# Patient Record
Sex: Male | Born: 2000 | Race: White | Hispanic: No | Marital: Single | Smoking: Never smoker
Health system: Southern US, Community
[De-identification: ages and names within clinical notes are randomized; demographics above are authoritative.]

## PROBLEM LIST (undated history)

## (undated) DIAGNOSIS — H6690 Otitis media, unspecified, unspecified ear: Secondary | ICD-10-CM

## (undated) DIAGNOSIS — T7840XA Allergy, unspecified, initial encounter: Secondary | ICD-10-CM

## (undated) DIAGNOSIS — J329 Chronic sinusitis, unspecified: Secondary | ICD-10-CM

## (undated) HISTORY — PX: TYMPANOSTOMY TUBE PLACEMENT: SHX32

## (undated) HISTORY — DX: Allergy, unspecified, initial encounter: T78.40XA

---
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
## 2019-01-12 ENCOUNTER — Other Ambulatory Visit: Payer: Self-pay | Admitting: Pediatrics

## 2019-01-12 DIAGNOSIS — J4520 Mild intermittent asthma, uncomplicated: Secondary | ICD-10-CM

## 2019-01-21 ENCOUNTER — Ambulatory Visit (INDEPENDENT_AMBULATORY_CARE_PROVIDER_SITE_OTHER): Payer: Medicaid Other | Admitting: Pediatrics

## 2019-01-21 ENCOUNTER — Other Ambulatory Visit: Payer: Self-pay

## 2019-01-21 DIAGNOSIS — L243 Irritant contact dermatitis due to cosmetics: Secondary | ICD-10-CM

## 2019-01-21 MED ORDER — PREDNISONE 10 MG PO TABS: 10.0000 mg | ORAL_TABLET | Freq: Every day | ORAL | 0 refills | Status: DC

## 2019-01-21 NOTE — Progress Notes (Signed)
Virtual Visit via Telephone Note  I connected with Todd Jacobs on 01/21/19 at  2:10 PM EDT by a video enabled telemedicine application and verified that I am speaking with the correct person using two identifiers.  Location: Patient: Todd Jacobs, accompanied by his mom Provider: Harolyn Rutherford, DO Attending: Dr. Lockie Pares   I discussed the limitations of evaluation and management by telemedicine and the availability of in person appointments. The patient expressed understanding and agreed to proceed.  History of Present Illness: Todd Jacobs is a 18 y/o male with PMH of asthma, seasonal allergies, and acne vulgaris who presents today with a facial rash. He states he applied some lotion with menthol.Shortly after he began developing a red, non-painful, itchy rash all over his face and the areas where he applied the gel. It is very itchy and is getting worse with some swelling. He denies any difficulty breathing, SOB, throat closing, fever, nausea, or vomiting. No skin peeling.   Observations/Objective: Gen: reports no distress Resp: speaks in full sentences  Assessment and Plan:  Facial Rash Most likely caused by a reaction from some component of the gel he used for sun and skin protection.  - Prednisone taper for 12 days. Instructions given to mom to have Todd Jacobs finish the full course of the taper and to start with 40mg  for 3 days then 30mg  for 3 days then 20mg  for 3 days and finally 10mg  for 3 days and then stop.  - Return precautions discussed.  Follow Up Instructions:  I discussed the assessment and treatment plan with the patient. The patient was provided an opportunity to ask questions and all were answered. The patient agreed with the plan and demonstrated an understanding of the instructions.   The patient was advised to call back or seek an in-person evaluation if the symptoms worsen or if the condition fails to improve as anticipated.  I provided >15 minutes of  non-face-to-face time during this encounter.   Todd Alpha, DO [facility], PGY-3

## 2019-02-26 IMAGING — CT CT TEMPORAL BONES W/O CM
1 series · 15 of 30 positions shown, 19 images · non-contrast
Comparison: None.

CLINICAL DATA: 16-year-old male with episodic vertigo. Non
pulsatile tinnitus. Bilateral hearing loss. Numbness for 1 month.
Tympanostomy tubes as a child.

EXAM:
CT TEMPORAL BONES WITHOUT CONTRAST
TECHNIQUE: Axial and coronal plane CT imaging of the petrous temporal bones was
performed with thin-collimation image reconstruction. No intravenous
contrast was administered. Multiplanar CT image reconstructions were
also generated.

[Series 4: brain · axial · 0.38mm/px · z∈[+1139,+1197]mm · 15 of 33 slices shown, 19 images]
[im 2/33  brain]
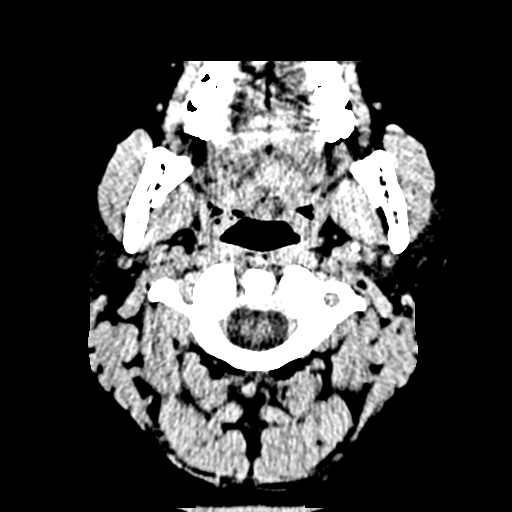
[im 2/33  bone]
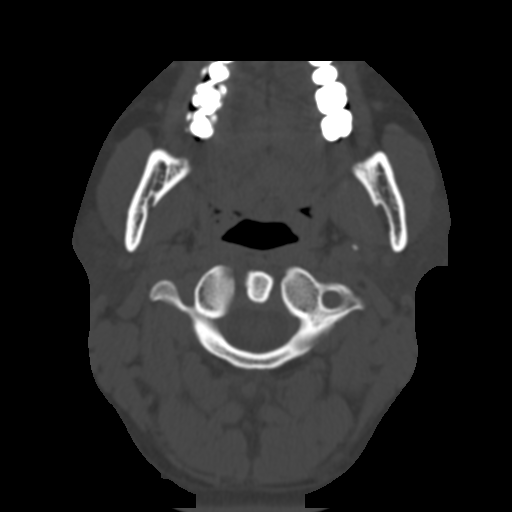
[im 4/33  bone]
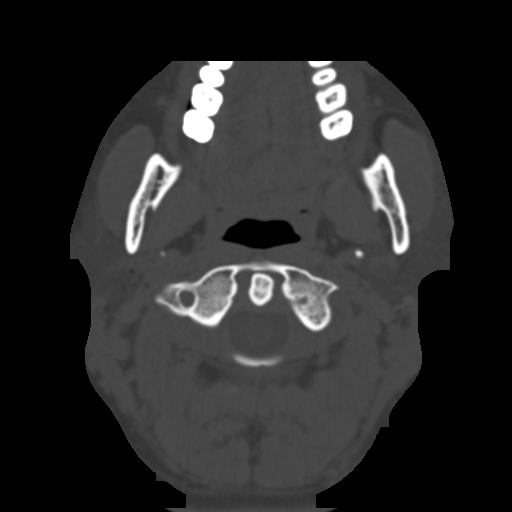
[im 6/33  bone]
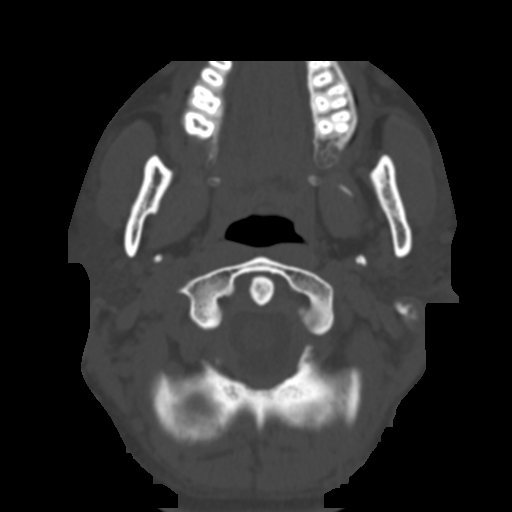
[im 8/33  bone]
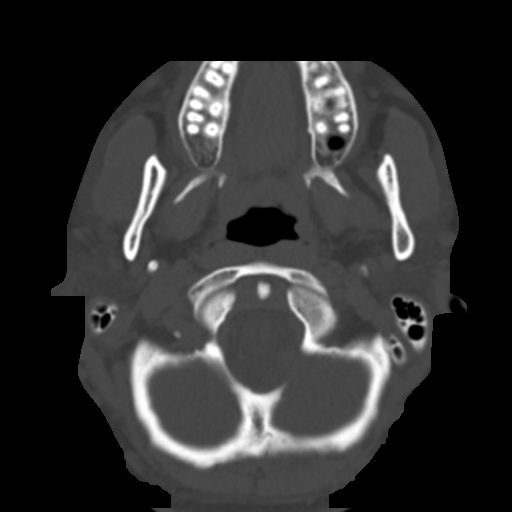
[im 10/33  brain]
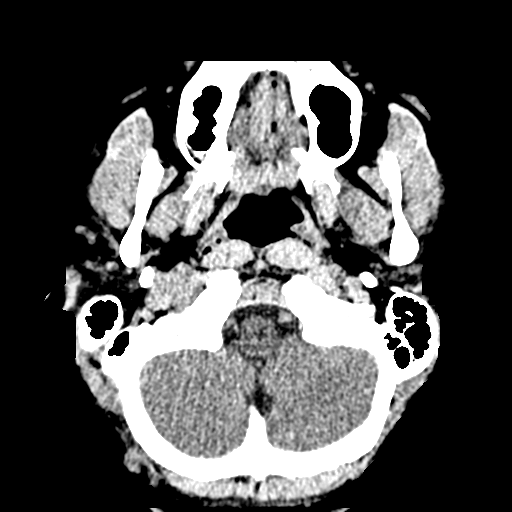
[im 10/33  bone]
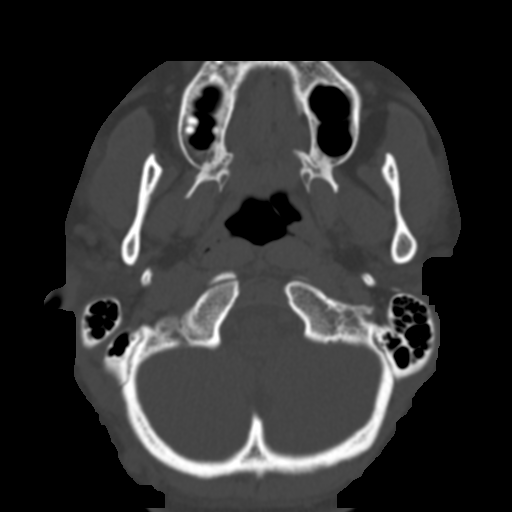
[im 13/33  bone]
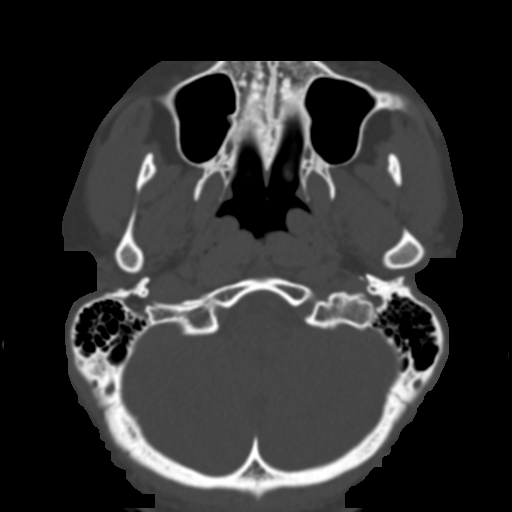
[im 15/33  bone]
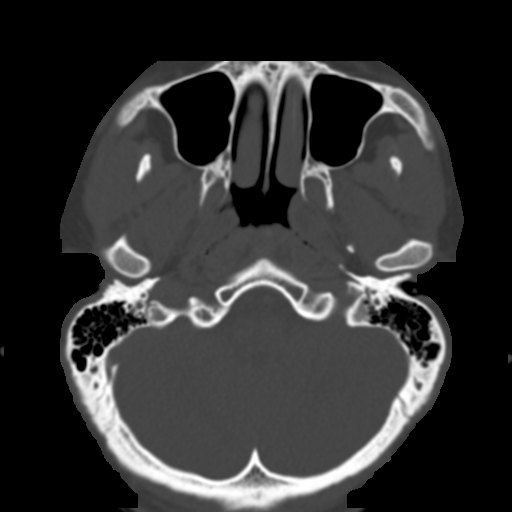
[im 17/33  bone]
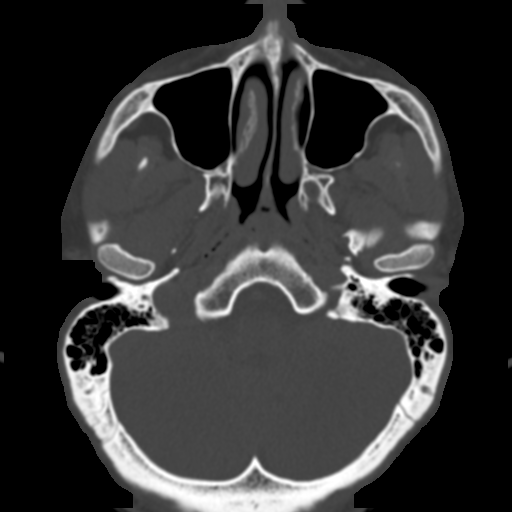
[im 18/33  brain]
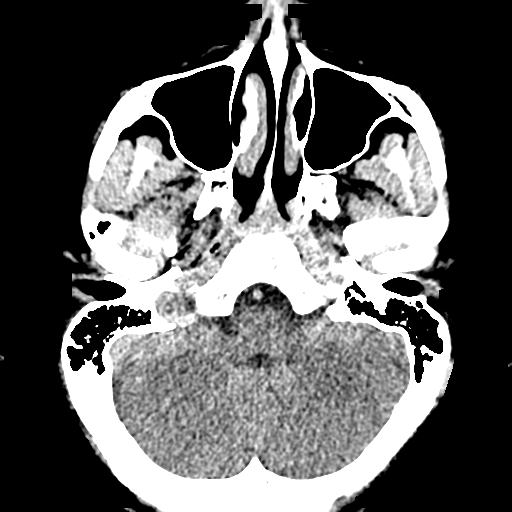
[im 18/33  bone]
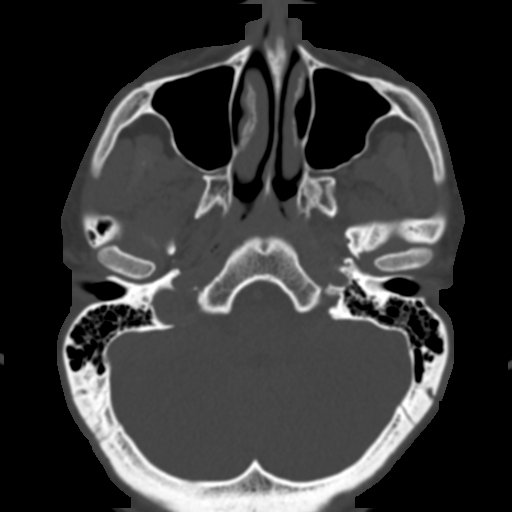
[im 20/33  bone]
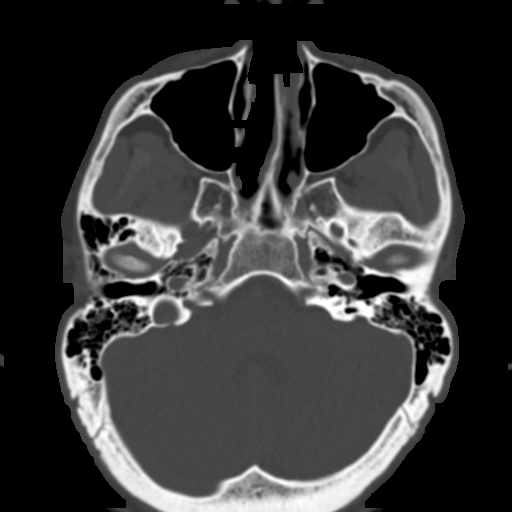
[im 23/33  bone]
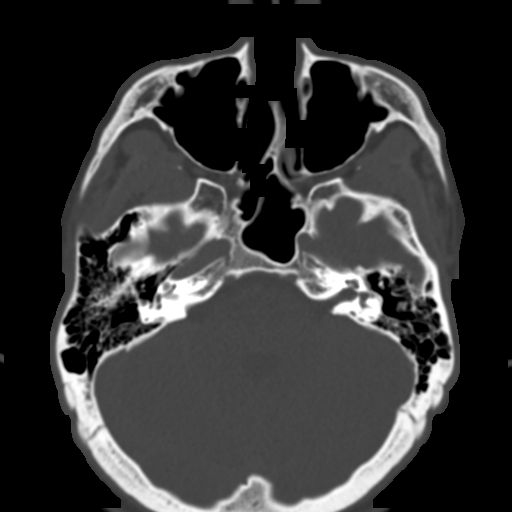
[im 25/33  bone]
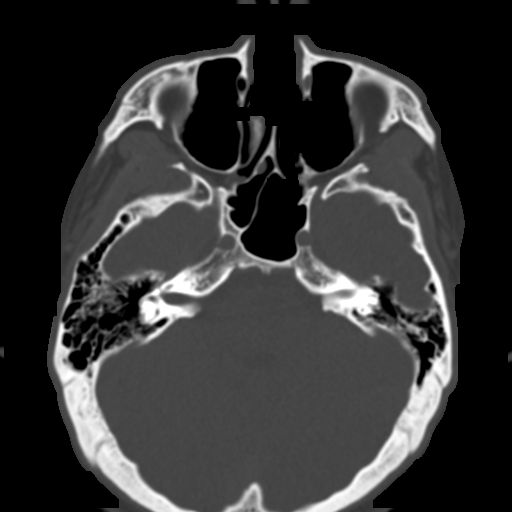
[im 27/33  brain]
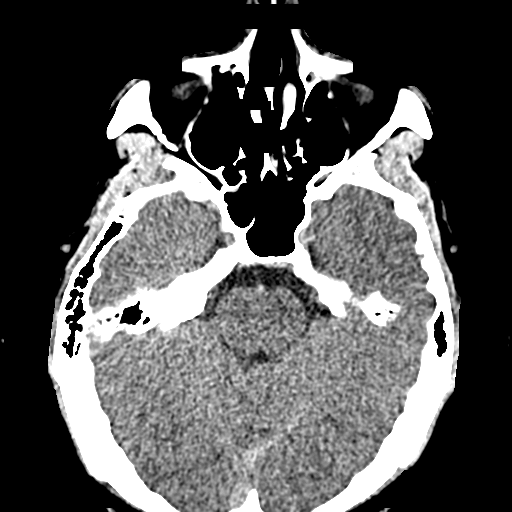
[im 27/33  bone]
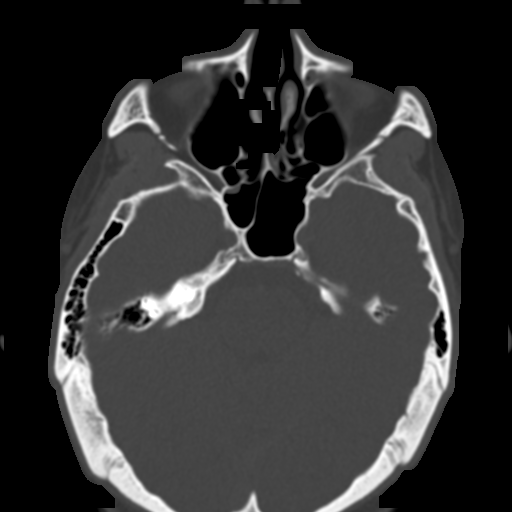
[im 29/33  bone]
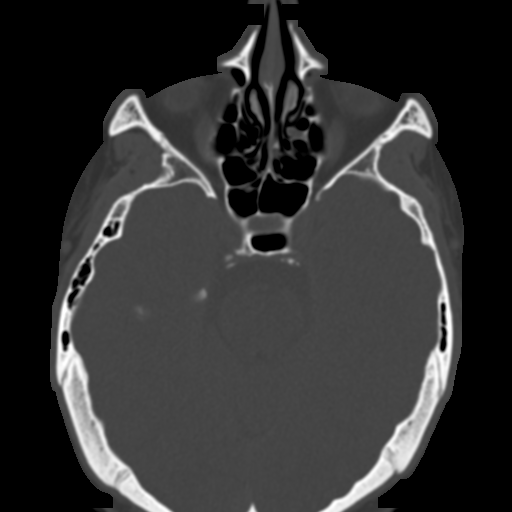
[im 31/33  bone]
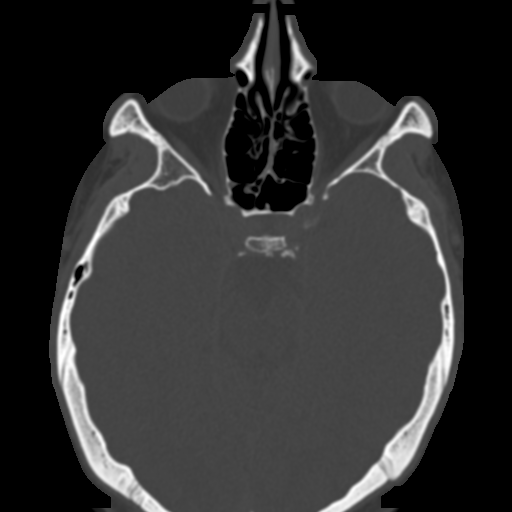

[15 of 30 positions shown; findings below may reference images not displayed]

FINDINGS: Negative visible noncontrast brain parenchyma. Visualized orbit soft
tissues are within normal limits. Visualized scalp soft tissues are
within normal limits. Negative visible noncontrast deep soft tissue
spaces of the face.

Minor bilateral mucosal thickening in the visible paranasal sinuses.
Skull base bone mineralization is normal.

LEFT TEMPORAL BONE:

Normal left external auditory canal except for minimal superficial
debris. The left tympanic membrane is normal. The left tympanic
cavity is clear. The ossicles are intact and normally aligned. The
scutum is intact. The left mastoid antrum and mastoid air cells are
clear.

The left IAC, cochlea, vestibule and vestibular aqueduct appear
normal. The left semicircular canals and course of the left 7th
nerve appear normal.

RIGHT TEMPORAL BONE:

Normal right EAC aside from minimal debris superficially. Normal
right tympanic membrane. The right tympanic cavity is clear. The
right scutum is intact. The right ossicles are intact and normally
aligned. The right mastoid antrum and right mastoid air cells are
clear. There is also pneumatization of the left lateral sphenoid
wing.

The right IAC, cochlea, vestibule and vestibular aqueduct appear
normal. The right semicircular canals and course of the right 7th
nerve appear normal.
IMPRESSION: 1. Normal CT appearance of the bilateral temporal bones.
2. Minimal paranasal sinus mucosal thickening.

## 2019-03-02 ENCOUNTER — Other Ambulatory Visit: Payer: Self-pay | Admitting: Pediatrics

## 2019-03-02 DIAGNOSIS — J302 Other seasonal allergic rhinitis: Secondary | ICD-10-CM

## 2019-03-02 NOTE — Telephone Encounter (Signed)
Refill per request. Needs appointment. Last appointment over 1 year ago.

## 2019-04-22 ENCOUNTER — Ambulatory Visit (INDEPENDENT_AMBULATORY_CARE_PROVIDER_SITE_OTHER): Payer: Medicaid Other | Admitting: Pediatrics

## 2019-04-22 ENCOUNTER — Other Ambulatory Visit: Payer: Self-pay

## 2019-04-22 DIAGNOSIS — Z20822 Contact with and (suspected) exposure to covid-19: Secondary | ICD-10-CM

## 2019-04-22 DIAGNOSIS — Z20828 Contact with and (suspected) exposure to other viral communicable diseases: Secondary | ICD-10-CM | POA: Diagnosis not present

## 2019-04-22 DIAGNOSIS — R509 Fever, unspecified: Secondary | ICD-10-CM

## 2019-04-22 DIAGNOSIS — J069 Acute upper respiratory infection, unspecified: Secondary | ICD-10-CM | POA: Insufficient documentation

## 2019-04-22 DIAGNOSIS — B349 Viral infection, unspecified: Secondary | ICD-10-CM | POA: Diagnosis not present

## 2019-04-22 NOTE — Assessment & Plan Note (Addendum)
-   Will send patient for COVID testing - Request to see patient again virtually tomorrow morning 04/23/2019 - If patient is worse or the same will request to see him in person tomorrow afternoon 04/23/2019 - Will hold off on prescribing antibiotics until after he is seen - Recommending drinking hot liquids to keep hydrated and to loosen mucus, Honey to decrease cough, Tylenol 500mg  with Advil 200mg  every 4-6 hours as needed for headache and body aches. -Continue daily allergy medications, ProAir albuterol inhaler, and flonase inhaler.

## 2019-04-22 NOTE — Progress Notes (Signed)
[facility] Telemedicine Visit  Patient consented to have virtual visit. Method of visit: Telephone  Encounter participants: Patient: Todd Jacobs - located at [facility] - [phone] (mom's cell) Provider: Daisy Floro - located at [facility] Others (if applicable): Maretta Los - mom  Chief Complaint: Diarrhea, body aches  HPI: Last Saturday (September 26th) the patient woke up saying he was up through the night with nausea and diarrhea. Mom watched him for several days and seemed to be bouncing back. Then a runny nose, sore throat, which is par for the course for them this time of year. Now he has body aches. He's getting tylenol 4 tablets every 4 hours, using his inhaler occasionally, allergy medicines (cetirizine and singulair), and mucinex and coricidin. He feels like he has a constriction in his back for 3 days. Patient endorses feeling feverish (thermometer at home is broken), some sneezing, little cough productive for little mucus, he started feeling shortness of breath with this painful point in his back, the first day last Saturday (26th) he was having nausea and he threw up, has had headaches. Diarrhea is watery but not happening every day (just on the first day and last night) green stool, is having abdominal pain down low in his abdomen in the middle, drinking a lot of water and staying hydrated, is able to keep foods down. Feels "like his face is going to fall off", some pain in his teeth.    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  His brother recently moved in and coughs all the time (is a fedex driver).  No new foods, they purchase bottled water, no burning or pain with urination, denies rashes.  Mom has history of lung cancer.  Mom states the patient does not Vape or Smoke.  ROS: per HPI  Pertinent PMHx: Allergies on Singulair, Albuterol  Exam:  Respiratory: speaking in full  sentences HEENT: Mom states he's having redness on his uvula with ?blisters, No pharyngeal exudates, Is having lymphadenopathy.   Assessment/Plan: Upper respiratory tract infection - Will send patient for COVID testing - Request to see patient again virtually tomorrow morning 04/23/2019 - If patient is worse or the same will request to see him in person tomorrow afternoon 04/23/2019 - Will hold off on prescribing antibiotics until after he is seen - Recommending drinking hot liquids to keep hydrated and to loosen mucus, Honey to decrease cough, Tylenol 500mg  with Advil 200mg  every 4-6 hours as needed for headache and body aches. -Continue daily allergy medications, ProAir albuterol inhaler, and flonase inhaler.    Time spent during visit with patient: 21 minutes  Milus Banister, [place], PGY-2 04/22/2019 10:57 AM

## 2019-04-23 ENCOUNTER — Ambulatory Visit (INDEPENDENT_AMBULATORY_CARE_PROVIDER_SITE_OTHER): Payer: Medicaid Other | Admitting: Pediatrics

## 2019-04-23 ENCOUNTER — Other Ambulatory Visit: Payer: Self-pay

## 2019-04-23 VITALS — Temp 96.9°F | Wt 212.0 lb

## 2019-04-23 DIAGNOSIS — B349 Viral infection, unspecified: Secondary | ICD-10-CM

## 2019-04-23 DIAGNOSIS — M791 Myalgia, unspecified site: Secondary | ICD-10-CM

## 2019-04-23 LAB — POC INFLUENZA A&B (BINAX/QUICKVUE)
Influenza A, POC: NEGATIVE
Influenza B, POC: NEGATIVE

## 2019-04-23 LAB — NOVEL CORONAVIRUS, NAA: SARS-CoV-2, NAA: NOT DETECTED

## 2019-04-23 MED ORDER — AMOXICILLIN-POT CLAVULANATE 875-125 MG PO TABS: 1.0000 | ORAL_TABLET | Freq: Two times a day (BID) | ORAL | 0 refills | Status: AC

## 2019-04-23 NOTE — Progress Notes (Addendum)
Subjective:     Todd Jacobs, is a 18 y.o. male   History provider by patient and mother No interpreter necessary.  No chief complaint on file.   HPI:  Patient is currently on day 10 of illness, is still having a runny/stuffy nose, sore throat, body aches, tightness in his chest and back. No diarrhea, shortness of breath. He was tested for COVID yesterday morning which was negative. He has been using tylenol, advil, flonase, and his allergy medications to control his symptoms.   Patient was asked during this encounter about smoking history: he denies vaping and smoking Sexual history was not assessed   Review of Systems   Patient's history was reviewed and updated as appropriate: He  has a past medical history of Asthma, Otitis media, and Sinusitis. He does not have any pertinent problems on file. He  has a past surgical history that includes Tympanostomy tube placement. His family history includes Allergies in an other family member; Asthma in an other family member. He  reports that he is a non-smoker but has been exposed to tobacco smoke. He has never used smokeless tobacco. No history on file for alcohol and drug. He has No Known Allergies..     Objective:     There were no vitals taken for this visit.  Physical Exam Vitals signs and nursing note reviewed.  Constitutional:      Appearance: He is normal weight.  HENT:     Head: Normocephalic and atraumatic.     Right Ear: External ear normal.     Left Ear: External ear normal.     Nose: Congestion and rhinorrhea present.     Mouth/Throat:     Mouth: Mucous membranes are moist.     Pharynx: Posterior oropharyngeal erythema present.     Comments: Swollen right tonsillar fossa with no exudate Eyes:     Extraocular Movements: Extraocular movements intact.     Conjunctiva/sclera: Conjunctivae normal.     Pupils: Pupils are equal, round, and reactive to light.  Neck:     Musculoskeletal: Normal range of  motion.  Cardiovascular:     Rate and Rhythm: Normal rate and regular rhythm.     Pulses: Normal pulses.     Heart sounds: Normal heart sounds.  Pulmonary:     Effort: Pulmonary effort is normal. No respiratory distress.     Breath sounds: Normal breath sounds.  Abdominal:     General: Abdomen is flat. Bowel sounds are normal.     Palpations: Abdomen is soft.  Musculoskeletal: Normal range of motion.  Skin:    General: Skin is warm.     Capillary Refill: Capillary refill takes 2 to 3 seconds.  Neurological:     General: No focal deficit present.     Mental Status: He is alert. Mental status is at baseline.     Cranial Nerves: No cranial nerve deficit.  Psychiatric:        Mood and Affect: Mood normal.        Behavior: Behavior normal.        Thought Content: Thought content normal.        Judgment: Judgment normal.        Assessment & Plan:    Todd Jacobs is a 18 y.o male with upper respiratory symptoms of a runny/stuffy nose, sore throat, body aches, tightness in his chest and back. These could be due to seasonal allergies, common cold, flu, pneumonia, walking pneumonia, sinus infection or strep throat.  Patient tested negative for COVID. We performed rapid influenza testing which was negative. Overall, he presents with a mixed picture of bacterial rhinosinusitis likely 2/2 Viral URTI. Empiric Tx as per UTD with augmentin.  Upper Respiratory Infection/Acute bacterial rhinosinusitis 1. Augmentin for 7 days. 2. Rapid Flu negative  Supportive care and return precautions reviewed.  No follow-ups on file.  Silvana Newness, MD

## 2019-04-23 NOTE — Progress Notes (Signed)
Virtual Visit via Video Note  I connected with Todd Jacobs and his mother on 04/23/19 at 10:20 AM EDT by a video enabled telemedicine application and verified that I am speaking with the correct person using two identifiers.   Location of patient/parent: home   I discussed the limitations of evaluation and management by telemedicine and the availability of in person appointments.  I discussed that the purpose of this telehealth visit is to provide medical care while limiting exposure to the novel coronavirus.  The mother and patient expressed understanding and agreed to proceed.  Reason for visit: follow up of body aches, sore throat, diarrhea  History of Present Illness:  Patient is currently on day 10 of illness, is still having a runny/stuffy nose, sore throat, body aches, tightness in his chest and back. No diarrhea, shortness of breath. He was tested for COVID yesterday morning which was negative. He has been using tylenol, advil, flonase, and his allergy medications to control his symptoms.   Patient was asked during this encounter about smoking history: he denies vaping and smoking Sexual history was not assessed  Observations/Objective: Patient is non-toxic appearing on video visit, walking around the house with ease, sounds congested, no shortness of breath, coughing, rashes or diaphoresis appreciated  Assessment and Plan: Patient's upper respiratory could be due to seasonal allergies, common cold, flu, pneumonia, walking pneumonia, or even a sinus infection. Strep throat is less likely due to age, presence of cough, and lack of exudates on physical exam. Patient tested negative for COVID.   Follow Up Instructions:  As patient's symptoms are about the same despite continued conservative measurements, it would be to the patient's benefit to be evaluated in person in the clinic.   -Patient scheduled for in-person visit today 04/23/2019 at 15:30   I discussed the assessment and  treatment plan with the patient and/or parent/guardian. They were provided an opportunity to ask questions and all were answered. They agreed with the plan and demonstrated an understanding of the instructions.   They were advised to call back or seek an in-person evaluation in the emergency room if the symptoms worsen or if the condition fails to improve as anticipated.  I spent 7 minutes on this telehealth visit inclusive of face-to-face video and care coordination time I was located at [facility] during this encounter.  Daisy Floro, DO

## 2019-04-23 NOTE — Patient Instructions (Signed)

## 2019-05-07 ENCOUNTER — Other Ambulatory Visit: Payer: Self-pay | Admitting: Pediatrics

## 2019-05-07 DIAGNOSIS — J302 Other seasonal allergic rhinitis: Secondary | ICD-10-CM

## 2019-05-07 DIAGNOSIS — J4520 Mild intermittent asthma, uncomplicated: Secondary | ICD-10-CM

## 2019-05-07 NOTE — Telephone Encounter (Signed)
I approved a refill request for Todd Jacobs's montelukast and albuterol today.  He is overdue for his annual PE. Please call him to schedule a PE with Dr. Tami Ribas (next available).

## 2019-06-01 ENCOUNTER — Other Ambulatory Visit: Payer: Self-pay

## 2019-06-01 ENCOUNTER — Other Ambulatory Visit (HOSPITAL_COMMUNITY)
Admission: RE | Admit: 2019-06-01 | Discharge: 2019-06-01 | Disposition: A | Discharge status: Home / Self Care | Payer: Medicaid Other | Source: Ambulatory Visit | Attending: Pediatrics | Admitting: Pediatrics

## 2019-06-01 ENCOUNTER — Encounter: Payer: Self-pay | Admitting: Pediatrics

## 2019-06-01 ENCOUNTER — Ambulatory Visit (INDEPENDENT_AMBULATORY_CARE_PROVIDER_SITE_OTHER): Payer: Medicaid Other | Admitting: Pediatrics

## 2019-06-01 VITALS — BP 110/80 | HR 74 | Ht 67.5 in | Wt 224.2 lb

## 2019-06-01 DIAGNOSIS — J302 Other seasonal allergic rhinitis: Secondary | ICD-10-CM | POA: Diagnosis not present

## 2019-06-01 DIAGNOSIS — Z0001 Encounter for general adult medical examination with abnormal findings: Secondary | ICD-10-CM

## 2019-06-01 DIAGNOSIS — Z23 Encounter for immunization: Secondary | ICD-10-CM | POA: Diagnosis not present

## 2019-06-01 DIAGNOSIS — E6609 Other obesity due to excess calories: Secondary | ICD-10-CM

## 2019-06-01 DIAGNOSIS — J4520 Mild intermittent asthma, uncomplicated: Secondary | ICD-10-CM | POA: Diagnosis not present

## 2019-06-01 DIAGNOSIS — Z113 Encounter for screening for infections with a predominantly sexual mode of transmission: Secondary | ICD-10-CM | POA: Diagnosis not present

## 2019-06-01 DIAGNOSIS — Z68.41 Body mass index (BMI) pediatric, greater than or equal to 95th percentile for age: Secondary | ICD-10-CM | POA: Diagnosis not present

## 2019-06-01 LAB — POCT RAPID HIV: Rapid HIV, POC: NEGATIVE

## 2019-06-01 MED ORDER — MONTELUKAST SODIUM 10 MG PO TABS: 10.0000 mg | ORAL_TABLET | Freq: Every day | ORAL | 11 refills | Status: DC

## 2019-06-01 NOTE — Progress Notes (Signed)
Adolescent Well Care Visit JOHNOTHAN BASCOMB is a 18 y.o. male who is here for well care.    PCP:  [name], MD   History was provided by the patient.  Confidentiality was discussed with the patient and, if applicable, with caregiver as well. Patient's personal or confidential phone number: [phone]   Current Issues: Current concerns include None.   Prior Concerns:  mild int asthma-last treated asthma 1-2 months ago. Has some exercise induced symptoms. Has albuterol inhaler and does not have nor want a spacer.  Seasonal allergies-has zyrtec and singulair and flonase prescriptions.   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 Last saw GI 12/2018 and no problems since stopping the meds.   menier's disease-resolved  Last CPE 2018-needs transition of care. Too old to get vaccines  Nutrition: Nutrition/Eating Behaviors: trying to eat more healthy proteins and fruits but not good about veggies.  Adequate calcium in diet?: every day servings. Supplements/ Vitamins: no  Exercise/ Media: Play any Sports?/ Exercise: 3-4 days per week.  Screen Time:  > 2 hours-counseling provided Media Rules or Monitoring?: no  Sleep:  Sleep: 10-6. No sleep problems-on screen before goes to sleep.  Social Screening: Lives with:  Mom Dad Uncle Brother Parental relations:  good Activities, Work, and Regulatory affairs officer?: works Holiday representative Concerns regarding behavior with peers?  no Stressors of note: no  Education: School Name: out of school-completed high school. Plans to go to college if possible-Will go to community college first.     Menstruation:   No LMP for male patient. Menstrual History: NA   Confidential Social History: Tobacco?  no, smokes marijuana several days per week.  Secondhand smoke exposure?  yes Drugs/ETOH?  yes, as above and occasional ETOH  Sexually Active?  yes   Pregnancy Prevention: condoms  Safe at home, in school & in  relationships?  No - keeps a weapon on him-knife or gun-risks reviewed.  Safe to self?  Yes   Screenings: Patient has a dental home: yes  The patient completed the Rapid Assessment of Adolescent Preventive Services (RAAPS) questionnaire, and identified the following as issues: eating habits, exercise habits, weapon use, tobacco use, other substance use, reproductive health and mental health.  Issues were addressed and counseling provided.  Additional topics were addressed as anticipatory guidance.  PHQ-9 completed and results indicated no current concerns  Physical Exam:  Vitals:   06/01/19 1029  BP: 110/80  Pulse: 74  Weight: 224 lb 3.2 oz (101.7 kg)  Height: 5' 7.5" (1.715 m)   BP 110/80 (BP Location: Right Arm, Patient Position: Sitting, Cuff Size: Normal)   Pulse 74   Ht 5' 7.5" (1.715 m)   Wt 224 lb 3.2 oz (101.7 kg)   BMI 34.60 kg/m  Body mass index: body mass index is 34.6 kg/m. Blood pressure percentiles are not available for patients who are 18 years or older.   Hearing Screening   Method: Audiometry   125Hz  250Hz  500Hz  1000Hz  2000Hz  3000Hz  4000Hz  6000Hz  8000Hz   Right ear:   20 20 20  20     Left ear:   20 20 20  20       Visual Acuity Screening   Right eye Left eye Both eyes  Without correction: 20/20 20/25   With correction:       General Appearance:   alert, oriented, no acute distress and obese  HENT: Normocephalic, no obvious abnormality, conjunctiva clear  Mouth:   Normal appearing teeth, no obvious discoloration, dental caries, or dental caps  Neck:   Supple; thyroid: no enlargement, symmetric, no tenderness/mass/nodules  Chest Normal male  Lungs:   Clear to auscultation bilaterally, normal work of breathing  Heart:   Regular rate and rhythm, S1 and S2 normal, no murmurs;   Abdomen:   Soft, non-tender, no mass, or organomegaly  GU normal male genitals, no testicular masses or hernia, Tanner stage 5  Musculoskeletal:   Tone and strength strong and  symmetrical, all extremities               Lymphatic:   No cervical adenopathy  Skin/Hair/Nails:   Skin warm, dry and intact, no rashes, no bruises or petechiae  Neurologic:   Strength, gait, and coordination normal and age-appropriate     Assessment and Plan:   1. Encounter for general adult medical examination with abnormal findings Exam normal except obesity History allergic rhinitis-seasonal and mild int asthma with exercise induced symptoms.   BMI is not appropriate for age  Hearing screening result:normal Vision screening result: normal  Counseling provided for all of the vaccine components  Orders Placed This Encounter  Procedures  . POC Rapid HIV    2. Obesity due to excess calories without serious comorbidity with body mass index (BMI) in 95th to 98th percentile for age in pediatric patient Counseled regarding 5-2-1-0 goals of healthy active living including:  - eating at least 5 fruits and vegetables a day - at least 1 hour of activity - no sugary beverages - eating three meals each day with age-appropriate servings - age-appropriate screen time - age-appropriate sleep patterns     3. Seasonal allergic rhinitis, unspecified trigger Continue flonase and zyrtec as prescribed and singulair during high risk season.  - montelukast (SINGULAIR) 10 MG tablet; Take 1 tablet (10 mg total) by mouth at bedtime.  Dispense: 30 tablet; Refill: 11  4. Mild intermittent chronic asthma without complication Continue albuterol prn as prescribed Return precautions reviewed.   5. Routine screening for STI (sexually transmitted infection)  - GC/Chlamydia [facility] - for urine and other sample types - POC Rapid HIV  6. Need for immunization against influenza Needs flu vaccine but aged out of vaccine for children program-instructed to go to [facility] or pharmacy.   Resources given for transition to adult health care.     Return for Patient to transfer to adult  care.Rae Lips, MD

## 2019-06-01 NOTE — Patient Instructions (Signed)
Adult Primary Care Clinics Name Criteria Services   [facility]  Address: [address] [place], [place] [address]  Phone: [phone] Hours: Monday - Friday 9 AM -6 PM  Types of insurance accepted:  Commercial insurance [facility] (orange card) Medicaid Medicare Uninsured  Language services:  Video and phone interpreters available   Ages 18 and older    Adult primary care Onsite pharmacy Integrated behavioral health Financial assistance counseling Walk-in hours for established patients  Financial assistance counseling hours: Tuesdays 2:00PM - 5:00PM  Thursday 8:30AM - 4:30PM  Space is limited, 10 on Tuesday and 20 on Thursday. It's on first come first serve basis  Name Criteria Services   [facility]  Address: [address] [place], [place] [address]  Phone: [phone]  Hours: Monday - Friday 8:30 AM - 5 PM  Types of insurance accepted:  Commercial insurance Medicaid Medicare Uninsured  Language services:  Video and phone interpreters available   All ages - newborn to adult   Primary care for all ages (children and adults) Integrated behavioral health Nutritionist Financial assistance counseling   Name Criteria Services   [facility]  Located on the ground floor of [facility]  Address: [address]  [place],  [place]  [address]  Phone: [phone]  Hours: Monday - Friday 8:15 AM - 5 PM  Types of insurance accepted:  Commercial insurance Medicaid Medicare Uninsured  Language services:  Video and phone interpreters available   Ages 18 and older   Adult primary care Nutritionist Certified Diabetes Educator  Integrated behavioral health Financial assistance counseling   Name Criteria Services   [facility]  Address: [address] [place], [place] [address]  Phone:  [phone]  Hours: Monday - Friday 8:30 AM - 5 PM    Types of insurance accepted:  Commercial insurance Medicaid Medicare Uninsured  Language services:  Video and phone interpreters available   All ages - newborn to adult   Primary care for all ages (children and adults) Integrated behavioral health Financial assistance counseling    

## 2019-06-02 LAB — URINE CYTOLOGY ANCILLARY ONLY
Chlamydia: NEGATIVE
Comment: NEGATIVE
Comment: NORMAL
Neisseria Gonorrhea: NEGATIVE

## 2019-06-06 ENCOUNTER — Other Ambulatory Visit: Payer: Self-pay | Admitting: Pediatrics

## 2019-06-06 DIAGNOSIS — J4520 Mild intermittent asthma, uncomplicated: Secondary | ICD-10-CM

## 2019-07-20 ENCOUNTER — Telehealth (INDEPENDENT_AMBULATORY_CARE_PROVIDER_SITE_OTHER): Payer: Self-pay | Admitting: Pediatric Gastroenterology

## 2019-07-20 ENCOUNTER — Telehealth (INDEPENDENT_AMBULATORY_CARE_PROVIDER_SITE_OTHER): Payer: Self-pay

## 2019-07-20 NOTE — Telephone Encounter (Signed)
  Who's calling (name and relationship to patient) : Marylene Land (mom) Best contact number:  [phone] Provider they see: Jacqlyn Krauss  Reason for call: Mom states that patient is having a flareup with stomach pain, cramping. Mom called on behalf of the patient.  Appt made but she would like to speak with someone.  Please call.   She stated that he start back taking his GI medication.     PRESCRIPTION REFILL ONLY  Name of prescription:  Pharmacy:

## 2019-07-20 NOTE — Telephone Encounter (Signed)
Return call to number provided above identified as Gentry Roch but mail box is full

## 2019-07-20 NOTE — Telephone Encounter (Signed)
  Who's calling (name and relationship to patient) : Marylene Land Ament/mom  Best contact number: [phone] Provider they see:  Reason for call: Patient called earlier and received a call back but her mailbox is full. Pt stated that she cant access my chart and would like a call back. please advise      PRESCRIPTION REFILL ONLY  Name of prescription:  Pharmacy:

## 2019-07-26 NOTE — Telephone Encounter (Signed)
-----   Message from Osa Craver, [place] sent at 07/21/2019  2:31 PM EST ----- Regarding: FW: Please restart amitryptiline  ----- Message ----- From: Salem Senate, MD Sent: 07/20/2019   3:11 PM EST To: Pssg Clinical Pool Subject: Please restart amitryptiline                   Please restart amitriptyline 25 mg QHS after you confirm that his symptoms are similar to previous symptoms. Please inquire about "red flags" such [name] dysphagia (food getting stuck in the throat or chest after swallowing), weight loss, ever, joint pain or swelling of joints, back pain, trouble sitting, jaundice, pruritus, erythema nodosum (red, painful bumps), eye redness, eye pain, shortness of breath, or oral ulceration.  Thank you, FAS

## 2019-07-28 NOTE — Telephone Encounter (Signed)
Called. Left Vm to have call returned.

## 2019-07-30 ENCOUNTER — Encounter (INDEPENDENT_AMBULATORY_CARE_PROVIDER_SITE_OTHER): Payer: Self-pay

## 2019-07-30 NOTE — Telephone Encounter (Signed)
Called. No answer. VM full. Will send letter

## 2019-08-03 ENCOUNTER — Other Ambulatory Visit: Payer: Self-pay | Admitting: Pediatrics

## 2019-08-03 DIAGNOSIS — J4520 Mild intermittent asthma, uncomplicated: Secondary | ICD-10-CM

## 2019-08-06 ENCOUNTER — Encounter (INDEPENDENT_AMBULATORY_CARE_PROVIDER_SITE_OTHER): Payer: Self-pay

## 2019-08-19 ENCOUNTER — Telehealth (INDEPENDENT_AMBULATORY_CARE_PROVIDER_SITE_OTHER): Payer: Self-pay | Admitting: Pediatric Gastroenterology

## 2019-08-19 DIAGNOSIS — R110 Nausea: Secondary | ICD-10-CM

## 2019-08-19 DIAGNOSIS — K30 Functional dyspepsia: Secondary | ICD-10-CM

## 2019-08-19 DIAGNOSIS — R109 Unspecified abdominal pain: Secondary | ICD-10-CM

## 2019-08-19 NOTE — Telephone Encounter (Signed)
°  Who's calling (name and relationship to patient) : Dorr Perrot mom  Best contact number: [phone]  Provider they see: Dr. Jacqlyn Krauss  Reason for call: Mom called requesting to speak with Jacqlyn Krauss or Tiffany ASAP about Jeron's situation. Mom says Cem is not improving and mom would like to speak with Dr. Jacqlyn Krauss about what to do.     PRESCRIPTION REFILL ONLY  Name of prescription:  Pharmacy:

## 2019-08-19 NOTE — Telephone Encounter (Signed)
Spoke with patient after getting a good number for him from his mom. Patient states that he is unable to access my chart and doesn't have a cell phone. New number put in is the patients house phone.  Symptoms have been going on for 8 days. He is having nausea and stomach pain. Diarrhea with no weight loss. Pain in the lower part of the stomach. No vomiting. No fever. Pain is off and on through out the day. He is taking 20 mg of Amitriptyline at night that seems to help some with the pain. Pain level before taking is about a 7. After is a 3/4. His diet consist of chicken, fruit when available and water. I informed the patient that we would send in the correct Rx for the Amitriptyline 25 my at bedtime. And would reach out to dr Jacqlyn Krauss for further advise.

## 2019-08-20 NOTE — Telephone Encounter (Signed)
Do you recommend anyone?

## 2019-08-20 NOTE — Telephone Encounter (Signed)
He may transition to adult GI due to his age. Thank you

## 2019-08-23 ENCOUNTER — Telehealth (INDEPENDENT_AMBULATORY_CARE_PROVIDER_SITE_OTHER): Payer: Medicaid Other | Admitting: Pediatric Gastroenterology

## 2019-08-23 ENCOUNTER — Encounter: Payer: Self-pay | Admitting: Gastroenterology

## 2019-08-23 NOTE — Telephone Encounter (Signed)
Someone at [facility] would be fine

## 2019-08-23 NOTE — Progress Notes (Deleted)
This is a Pediatric Specialist E-Visit follow up consult provided via [place]  consented to an E-Visit consult today.  Location of patient: Decarlos is at his home (location) Location of provider: Harold Hedge is at [facility] telemedicine center (location) Patient was referred by Rae Lips, MD   The following participants were involved in this E-Visit: Luka and me (list of participants and their roles)  Chief Complain/ Reason for E-Visit today: Nausea and abdominal pain Total time on call: 10 minutes, plus 5 minutes of pre-charting and 5 minutes of dictation time. Follow up: as needed      Pediatric Gastroenterology Return Visit   REFERRING PROVIDER:  Rae Lips, MD [place],  [place] [address]   ASSESSMENT:     I had the pleasure of seeing Todd Jacobs, 19 y.o. male (DOB: 2000-09-05) who I saw in follow up today for evaluation of chronic nausea. 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  This is according to Rome IV criteria. His symptoms have resolved. We will stop amitriptyline and see him back as needed.       PLAN:       Stop amitriptyline  Provided contact number for the clinic in case the family needs help before the next visit Thank you for allowing [name] to participate in the care of your patient      HISTORY OF PRESENT ILLNESS: Todd Jacobs is a 19 y.o. male (DOB: [birth year]) who is seen in follow-up for evaluation of chronic nausea and abdominal discomfort. History was obtained from [place].  He did well on a reduced dose of amitriptyline. He is eating well.  He has had no discernible side effects from amitriptyline.  He is sleeping well.  His nausea has decreased markedly and so has his abdominal discomfort. He is active outdoors.  History obtained during his first visit  His symptoms date back years.  His  most bothersome symptom from the digestive perspective is nausea.  The nausea occurs after meals.  Despite nausea however, he is able to finish his meals.  He does not vomit but he sleeps with a bowl next to his bed.  He has no dysphagia.  His symptoms have not impaired his growth.  She also has periumbilical abdominal pain when he is nauseated.  He has not missed school because of nausea.  He has tried different therapies over the years.  Most recently he tried omeprazole for the past 2 months, 20 mg daily.  However he continues to be nauseated.  He also takes Zofran for episodes of nausea and Zofran does help.  However he takes Zofran almost daily.  Certain foods affect his symptoms.  He has tried to decrease spicy foods in his diet.  This worked for a period of time but then his symptoms of nausea came back.  He is being evaluated for chronic dizziness.  His mother has a history of Mnire's disease.  Samir has had no abdominal surgery.  PAST MEDICAL HISTORY: Past Medical History:  Diagnosis Date  . Asthma   . Otitis media   . Sinusitis    Immunization History  Administered Date(s) Administered  . DTaP 01/01/2000, 06/24/2000, 03/20/2001, 10/29/2001, 01/23/2005  . HPV 9-valent 04/10/2015, 10/29/2016, 10/27/2017  . Hepatitis A, Ped/Adol-2 Dose 04/18/2014, 04/10/2015  . Hepatitis B 12/04/1999, 03/20/2001, 07/02/2002  . HiB (PRP-OMP) 01/01/2000, 06/24/2000, 03/20/2001,  10/29/2001  . IPV 01/01/2000, 06/24/2000, 07/02/2002, 01/23/2005  . Influenza,inj,Quad PF,6+ Mos 04/12/2016, 08/13/2017  . Influenza,inj,quad, With Preservative 04/05/2014  . MMR 03/20/2001, 01/23/2005  . Meningococcal Conjugate 04/05/2014, 10/29/2016  . Pneumococcal Conjugate-13 01/01/2000, 06/24/2000, 10/29/2001, 07/02/2002  . Tdap 04/10/2011  . Varicella 03/20/2001, 04/13/2014   PAST SURGICAL HISTORY: Past Surgical History:  Procedure Laterality Date  . TYMPANOSTOMY TUBE PLACEMENT     SOCIAL HISTORY: Social  History   Socioeconomic History  . Marital status: Single    Spouse name: Not on file  . Number of children: Not on file  . Years of education: Not on file  . Highest education level: Not on file  Occupational History  . Not on file  Tobacco Use  . Smoking status: Passive Smoke Exposure - Never Smoker  . Smokeless tobacco: Never Used  . Tobacco comment: dad smokes outside  Substance and Sexual Activity  . Alcohol use: Not on file  . Drug use: Not on file  . Sexual activity: Not on file  Other Topics Concern  . Not on file  Social History Narrative   He graduated [employer]      He lives with both parents and uncle. He has 5 siblings.   He enjoys basketball, football, and music.   Social Determinants of Health   Financial Resource Strain:   . Difficulty of Paying Living Expenses: Not on file  Food Insecurity:   . Worried About Charity fundraiser in the Last Year: Not on file  . Ran Out of Food in the Last Year: Not on file  Transportation Needs:   . Lack of Transportation (Medical): Not on file  . Lack of Transportation (Non-Medical): Not on file  Physical Activity:   . Days of Exercise per Week: Not on file  . Minutes of Exercise per Session: Not on file  Stress:   . Feeling of Stress : Not on file  Social Connections:   . Frequency of Communication with Friends and Family: Not on file  . Frequency of Social Gatherings with Friends and Family: Not on file  . Attends Religious Services: Not on file  . Active Member of Clubs or Organizations: Not on file  . Attends Archivist Meetings: Not on file  . Marital Status: Not on file   FAMILY HISTORY: family history includes Allergies in an other family member; Asthma in an other family member.   REVIEW OF SYSTEMS:  The balance of 12 systems reviewed is negative except as noted in the HPI.  MEDICATIONS: Current Outpatient Medications  Medication Sig Dispense Refill  . cetirizine (ZYRTEC) 10 MG  tablet TAKE 1 TABLET BY MOUTH DAILY 30 tablet 11  . fluticasone (FLONASE) 50 MCG/ACT nasal spray Place 2 sprays into both nostrils daily. Use AFTER clearing nose with nasal saline (Patient not taking: Reported on 01/21/2019) 16 g 12  . montelukast (SINGULAIR) 10 MG tablet Take 1 tablet (10 mg total) by mouth at bedtime. 30 tablet 11  . PROAIR HFA 108 (90 Base) MCG/ACT inhaler INHALE 2 PUFFS INTO THE LUNGS EVERY 4 HOURS AS NEEDED FOR WHEEZING OR COUGH 18 g 1   No current facility-administered medications for this visit.   ALLERGIES: Patient has no known allergies.  VITAL SIGNS: There were no vitals taken for this visit. PHYSICAL EXAM: Not performed He looked well on video feed     Trevan Messman A. Yehuda Savannah, MD Chief, Division of Pediatric Gastroenterology Professor of Pediatrics

## 2019-08-30 ENCOUNTER — Ambulatory Visit (INDEPENDENT_AMBULATORY_CARE_PROVIDER_SITE_OTHER): Payer: Medicaid Other | Admitting: Pediatric Gastroenterology

## 2019-09-01 ENCOUNTER — Other Ambulatory Visit: Payer: Self-pay | Admitting: Pediatrics

## 2019-09-01 DIAGNOSIS — J302 Other seasonal allergic rhinitis: Secondary | ICD-10-CM

## 2019-09-15 ENCOUNTER — Ambulatory Visit: Payer: Medicaid Other | Admitting: Gastroenterology

## 2019-09-22 ENCOUNTER — Ambulatory Visit: Payer: Medicaid Other | Admitting: Gastroenterology

## 2019-09-30 ENCOUNTER — Other Ambulatory Visit: Payer: Self-pay | Admitting: Pediatrics

## 2019-09-30 DIAGNOSIS — J4520 Mild intermittent asthma, uncomplicated: Secondary | ICD-10-CM

## 2019-09-30 NOTE — Telephone Encounter (Signed)
I called and spoke with [facility].  He reports that he recently lost his inhaler so he needs a new one.  He reports using his inhaler about twice a week on average.  No recent increase in usage.

## 2019-10-02 ENCOUNTER — Other Ambulatory Visit (INDEPENDENT_AMBULATORY_CARE_PROVIDER_SITE_OTHER): Payer: Self-pay | Admitting: Pediatric Gastroenterology

## 2019-10-11 ENCOUNTER — Ambulatory Visit: Payer: Medicaid Other | Attending: Internal Medicine

## 2019-10-11 DIAGNOSIS — Z23 Encounter for immunization: Secondary | ICD-10-CM

## 2019-10-11 NOTE — Progress Notes (Signed)
   Covid-19 Vaccination Clinic  Name:  Todd Jacobs    MRN: [id number] DOB: 07/08/2001  10/11/2019  [name] was observed post Covid-19 immunization for 15 minutes without incident. He was provided with Vaccine Information Sheet and instruction to access the V-Safe system.   [name] was instructed to call 911 with any severe reactions post vaccine: . Difficulty breathing  . Swelling of face and throat  . A fast heartbeat  . A bad rash all over body  . Dizziness and weakness   Immunizations Administered    Name Date Dose VIS Date Route   Pfizer COVID-19 Vaccine 10/11/2019  9:22 AM 0.3 mL 06/25/2019 Intramuscular   Manufacturer: Pfizer, Inc   Lot: [id number]   NDC: [address]-2     

## 2019-10-11 NOTE — Progress Notes (Signed)
   Covid-19 Vaccination Clinic  Name:  Todd Jacobs    MRN: [id number] DOB: 08-Nov-2000  10/11/2019  Mr. Jawad was observed post Covid-19 immunization for 15 minutes without incident. He was provided with Vaccine Information Sheet and instruction to access the V-Safe system.   Mr. Chaloux was instructed to call 911 with any severe reactions post vaccine: Marland Kitchen Difficulty breathing  . Swelling of face and throat  . A fast heartbeat  . A bad rash all over body  . Dizziness and weakness   Immunizations Administered    Name Date Dose VIS Date Route   Pfizer COVID-19 Vaccine 10/11/2019  9:22 AM 0.3 mL 06/25/2019 Intramuscular   Manufacturer: [employer], [employer]   Lot: [id number]   NDC: [address]-6

## 2019-10-27 ENCOUNTER — Other Ambulatory Visit: Payer: Self-pay | Admitting: Pediatrics

## 2019-10-27 DIAGNOSIS — J302 Other seasonal allergic rhinitis: Secondary | ICD-10-CM

## 2019-11-03 ENCOUNTER — Ambulatory Visit: Payer: Medicaid Other | Attending: Internal Medicine

## 2019-11-03 DIAGNOSIS — Z23 Encounter for immunization: Secondary | ICD-10-CM

## 2019-11-03 NOTE — Progress Notes (Signed)
   Covid-19 Vaccination Clinic  Name:  Todd Jacobs    MRN: [id number] DOB: [birth year]  11/03/2019  Mr. Cocuzza was observed post Covid-19 immunization for 15 minutes without incident. He was provided with Vaccine Information Sheet and instruction to access the V-Safe system.   Mr. Begley was instructed to call 911 with any severe reactions post vaccine: Marland Kitchen Difficulty breathing  . Swelling of face and throat  . A fast heartbeat  . A bad rash all over body  . Dizziness and weakness   Immunizations Administered    Name Date Dose VIS Date Route   Pfizer COVID-19 Vaccine 11/03/2019  9:29 AM 0.3 mL 09/08/2018 Intramuscular   Manufacturer: [employer], [employer]   Lot: [id number]   NDC: [address]-4

## 2019-11-04 ENCOUNTER — Other Ambulatory Visit: Payer: Self-pay | Admitting: Pediatrics

## 2019-11-04 DIAGNOSIS — J4520 Mild intermittent asthma, uncomplicated: Secondary | ICD-10-CM

## 2019-12-02 ENCOUNTER — Other Ambulatory Visit (INDEPENDENT_AMBULATORY_CARE_PROVIDER_SITE_OTHER): Payer: Self-pay

## 2019-12-02 ENCOUNTER — Telehealth (INDEPENDENT_AMBULATORY_CARE_PROVIDER_SITE_OTHER): Payer: Self-pay | Admitting: Pediatric Gastroenterology

## 2019-12-02 ENCOUNTER — Encounter: Payer: Self-pay | Admitting: Pediatrics

## 2019-12-02 MED ORDER — AMITRIPTYLINE HCL 10 MG PO TABS: 10.0000 mg | ORAL_TABLET | Freq: Every day | ORAL | 0 refills | Status: DC

## 2019-12-02 NOTE — Telephone Encounter (Signed)
°  Who's calling (name and relationship to patient) : [name] mom   Best contact number: [phone]  Provider they see: Dr. Jacqlyn Krauss  Reason for call: A referral was placed for Todd Jacobs to attend a different office. They made an appoint for June 10th but they have run out of amitriptyline. Mom is requesting we refill it until their appointment on June 10th.   Patient states the Dr. Jacqlyn Krauss has prescribed this for them in the past.    PRESCRIPTION REFILL ONLY  Name of prescription: Amitriptyline  Pharmacy: [facility] [address]

## 2019-12-23 ENCOUNTER — Ambulatory Visit: Payer: Medicaid Other | Admitting: Physician Assistant

## 2020-01-07 ENCOUNTER — Telehealth (INDEPENDENT_AMBULATORY_CARE_PROVIDER_SITE_OTHER): Payer: Self-pay | Admitting: Pediatric Gastroenterology

## 2020-01-07 ENCOUNTER — Other Ambulatory Visit (INDEPENDENT_AMBULATORY_CARE_PROVIDER_SITE_OTHER): Payer: Self-pay

## 2020-01-07 MED ORDER — AMITRIPTYLINE HCL 10 MG PO TABS: 10.0000 mg | ORAL_TABLET | Freq: Every day | ORAL | 1 refills | Status: DC

## 2020-01-07 NOTE — Telephone Encounter (Signed)
Called Todd Jacobs and relayed to him that 2 months worth of amitriptyline was sent in to the pharmacy per the approval of Dr. Jacqlyn Krauss.

## 2020-01-07 NOTE — Telephone Encounter (Signed)
  Who's calling (name and relationship to patient) : Marylene Land ( mom)   Best contact number: [place] [phone]  Provider they see: Dr. Jacqlyn Krauss  Reason for call: mom called to ask if while they are waiting for a appointment with Dr. Christella Hartigan the adult docotor the patient has been referred to if Dr. Jacqlyn Krauss could send in a refill perscription for Elavil for the patient. He has been going through a lot of problems te doctor is aware of patient is meeting the new Dr. In July please advise     PRESCRIPTION REFILL ONLY  Name of prescription: Elavil  Pharmacy: [facility] [place] [facility]

## 2020-01-30 ENCOUNTER — Other Ambulatory Visit: Payer: Self-pay | Admitting: Pediatrics

## 2020-01-30 DIAGNOSIS — J4520 Mild intermittent asthma, uncomplicated: Secondary | ICD-10-CM

## 2020-02-08 ENCOUNTER — Encounter: Payer: Self-pay | Admitting: Physician Assistant

## 2020-02-08 ENCOUNTER — Ambulatory Visit (INDEPENDENT_AMBULATORY_CARE_PROVIDER_SITE_OTHER): Payer: Medicaid Other | Admitting: Physician Assistant

## 2020-02-08 VITALS — BP 112/82 | HR 101 | Ht 68.0 in | Wt 230.4 lb

## 2020-02-08 DIAGNOSIS — K30 Functional dyspepsia: Secondary | ICD-10-CM

## 2020-02-08 DIAGNOSIS — K219 Gastro-esophageal reflux disease without esophagitis: Secondary | ICD-10-CM | POA: Diagnosis not present

## 2020-02-08 MED ORDER — AMITRIPTYLINE HCL 10 MG PO TABS: 10.0000 mg | ORAL_TABLET | Freq: Every day | ORAL | 6 refills | Status: DC

## 2020-02-08 NOTE — Patient Instructions (Addendum)
If you are age 20 or older, your body mass index should be between 23-30. Your Body mass index is 35.03 kg/m. If this is out of the aforementioned range listed, please consider follow up with your Primary Care Provider.  If you are age 53 or younger, your body mass index should be between 19-25. Your Body mass index is 35.03 kg/m. If this is out of the aformentioned range listed, please consider follow up with your Primary Care Provider.   Continue Amitriptyline 10 mg at night. Refills have been sent to your pharmacy  Work on following a GERD diet.  Follow up in 6 months with Dr. Meridee Score.  He did

## 2020-02-08 NOTE — Progress Notes (Signed)
Subjective:    Patient ID: Todd Jacobs, male    DOB: 01-19-2001, 19 y.o.   MRN: [id number]  HPI  Todd Jacobs is a pleasant 19 year old white male, new to GI today self-referred with history of chronic dyspepsia.  Patient is otherwise in generally good health with history of asthma.  He had been followed by pediatric GI/Dr. 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  He has history of chronic nausea dating back to his early teen years.  He describes very frequent nausea without vomiting and intermittent heartburn.  He has been on amitriptyline over the past couple of years which has been quite helpful.  He says he was initially on a higher dose and then was weaned down to 10 mg at bedtime which she has been on over the past year. He has been doing well recently.  He says he may have some nausea 2  times per week but never any vomiting.  Very occasional heartburn depending on p.o. intake and usually 1-2 times will take care of it.  His appetite has been good, weight has been stable.  She denies any dysphagia or odynophagia.  Bowels are regular.  He is not on any regular aspirin or NSAIDs.  He is not been on PPI therapy for some time and says several years ago it was tried without a lot of success. He does admit to occasional marijuana use, maybe 3-4 times weekly at most.  This has been over the past couple of years. He has not had prior endoscopy or imaging.  He feels he is doing well on the amitriptyline, and does not feel that he needs any medication for his very occasional heartburn.  Review of Systems Pertinent positive and negative review of systems were noted in the above HPI section.  All other review of systems was otherwise negative.  Outpatient Encounter Medications as of 02/08/2020  Medication Sig  . amitriptyline (ELAVIL) 10 MG tablet Take 1 tablet (10 mg total) by mouth at bedtime.  . cetirizine (ZYRTEC) 10 MG tablet TAKE 1 TABLET(10 MG) BY  MOUTH DAILY  . clindamycin-benzoyl peroxide (BENZACLIN) gel APPLY EXTERNALLY TO THE AFFECTED AREA DAILY  . fluticasone (FLONASE) 50 MCG/ACT nasal spray USE 2 SPRAYS IN EACH NOSTRIL DAILY. USE AFTER CLEARING NOSE WITH NASALE SALINE.  Marland Kitchen montelukast (SINGULAIR) 10 MG tablet Take 1 tablet (10 mg total) by mouth at bedtime.  Marland Kitchen PROAIR HFA 108 (90 Base) MCG/ACT inhaler INHALE 2 PUFFS INTO THE LUNGS EVERY 4 HOURS AS NEEDED FOR WHEEZING OR COUGH  . [DISCONTINUED] amitriptyline (ELAVIL) 10 MG tablet Take 1 tablet (10 mg total) by mouth at bedtime.   No facility-administered encounter medications on file as of 02/08/2020.   No Known Allergies Patient Active Problem List   Diagnosis Date Noted  . BMI (body mass index), pediatric, 95-99% for age 07/01/2019  . 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 Asthma, chronic 04/05/2014  . Acne vulgaris 04/05/2014  . Seasonal allergic rhinitis 04/05/2014   Social History   Socioeconomic History  . Marital status: Single    Spouse name: Not on file  . Number of children: Not on file  . Years of education: Not on file  . Highest education level: Not on file  Occupational History  . Occupation: Consulting civil engineer  Tobacco Use  . Smoking status: Passive Smoke Exposure - Never Smoker  . Smokeless tobacco: Never Used  . Tobacco comment: dad smokes outside  Vaping Use  .  Vaping Use: Never used  Substance and Sexual Activity  . Alcohol use: Not Currently  . Drug use: Yes    Types: Marijuana  . Sexual activity: Not on file  Other Topics Concern  . Not on file  Social History Narrative   He graduated [employer] Last Year      He lives with both parents and uncle. He has 5 siblings.   He enjoys basketball, football, and music.   Social Determinants of Health   Financial Resource Strain:   . Difficulty of Paying Living Expenses:   Food Insecurity:   . Worried About Programme researcher, broadcasting/film/video in the Last Year:   . Barista in the Last Year:   Transportation Needs:     . Freight forwarder (Medical):   Marland Kitchen Lack of Transportation (Non-Medical):   Physical Activity:   . Days of Exercise per Week:   . Minutes of Exercise per Session:   Stress:   . Feeling of Stress :   Social Connections:   . Frequency of Communication with Friends and Family:   . Frequency of Social Gatherings with Friends and Family:   . Attends Religious Services:   . Active Member of Clubs or Organizations:   . Attends Banker Meetings:   Marland Kitchen Marital Status:   Intimate Partner Violence:   . Fear of Current or Ex-Partner:   . Emotionally Abused:   Marland Kitchen Physically Abused:   . Sexually Abused:     Mr. Devivo's family history includes Allergies in an other family member; Asthma in an other family member.      Objective:    Vitals:   02/08/20 1405  BP: 112/82  Pulse: 101    Physical Exam Well-developed well-nourished young WM male in no acute distress.  Height, Weight230, BMI 35.0  HEENT; nontraumatic normocephalic, EOMI, PER R LA, sclera anicteric. Oropharynx; not examined Neck; supple, no JVD Cardiovascular; regular rate and rhythm with S1-S2, no murmur rub or gallop Pulmonary; Clear bilaterally Abdomen; soft, nontender, nondistended, no palpable mass or hepatosplenomegaly, bowel sounds are active Rectal; not done Skin; benign exam, no jaundice rash or appreciable lesions Extremities; no clubbing cyanosis or edema skin warm and dry Neuro/Psych; alert and oriented x4, grossly nonfocal mood and affect appropriate       Assessment & Plan:   #82 19 year old white male with history of chronic nausea dating back several years, diagnosed with functional dyspepsia per Peds  gastroenterologist which has been well managed with low-dose amitriptyline at 10 mg nightly.  Patient is doing well currently with vague nausea couple of times per week and very sporadic heartburn.  And not inclined to feel that he needs further diagnostic work-up at this time as he is  doing well.  Plan; patient was given copy of an antireflux diet Refill amitriptyline 10 mg p.o. nightly x6 months.  Have asked him to follow-up with Dr. Meridee Score in 6 months.  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  Patient will follow up in 6 months, certainly happy to see in the interim for any problems.  Lissett Favorite S Eliyah Mcshea PA-C 02/08/2020   Cc: [name], MD

## 2020-02-09 ENCOUNTER — Telehealth: Payer: Self-pay

## 2020-02-09 NOTE — Telephone Encounter (Signed)
-----   Message from Sammuel Cooper, PA-C sent at 02/09/2020  2:16 PM EDT ----- Waynetta Sandy, pleasecall pt and ask him if he would come in  to get Hpylori stool Ag done- just want to be sure he doesn't have given long hx of intermittent nausea- thanks ----- Message ----- From: Lemar Lofty., MD Sent: 02/09/2020  12:32 PM EDT To: Sammuel Cooper, PA-C    ----- Message ----- From: Sammuel Cooper, PA-C Sent: 02/08/2020   4:56 PM EDT To: Lemar Lofty., MD

## 2020-02-09 NOTE — Progress Notes (Signed)
Attending Physician's Attestation   I have reviewed the chart.   I agree with the Advanced Practitioner's note, impression, and recommendations with any updates as below.  As patient is doing well, reasonable to hold on additional workup/management.  I would try to ensure that he has had H. Pylori testing of some sort done in the past (most likely).  If not then consider stool antigen testing.  Corliss Parish, MD [facility] Office # [phone]

## 2020-02-09 NOTE — Telephone Encounter (Signed)
Left message on machine to call back  

## 2020-02-15 ENCOUNTER — Other Ambulatory Visit: Payer: Self-pay

## 2020-02-15 DIAGNOSIS — K219 Gastro-esophageal reflux disease without esophagitis: Secondary | ICD-10-CM

## 2020-02-15 NOTE — Telephone Encounter (Signed)
Patient notified

## 2020-07-25 ENCOUNTER — Other Ambulatory Visit: Payer: Self-pay | Admitting: Pediatrics

## 2020-07-25 DIAGNOSIS — J302 Other seasonal allergic rhinitis: Secondary | ICD-10-CM

## 2020-11-10 ENCOUNTER — Telehealth: Payer: Self-pay

## 2020-11-10 NOTE — Telephone Encounter (Signed)
Spoke with Dr.Ettefagh about the call from mom and was advised to let them know that it is time for adult care. Tried calling mom back but there was no answer and was unable to leave a message. Will call again in a moment.

## 2020-11-10 NOTE — Telephone Encounter (Signed)
Mom left a message on the refill line requesting a refill for medications. I called her back for further information and she says that our office is listed on his healthy blue card. She is requesting refills for Cetirizine, Montelukast and an Pro-air inhaler. I let her know that I would call her back if there is further information needed.

## 2020-11-13 ENCOUNTER — Encounter: Payer: Self-pay | Admitting: *Deleted

## 2020-11-13 NOTE — Telephone Encounter (Signed)
Todd Jacobs and his mother notified that we will not be able to fill prescriptions at this time. Resources for Continued care and selection of another PCP sent to them via my chart. Mother and [place] voiced understanding.

## 2021-11-08 ENCOUNTER — Ambulatory Visit: Payer: Medicaid Other | Admitting: Family Medicine

## 2021-11-08 VITALS — BP 120/80 | HR 81 | Temp 97.6°F | Ht 68.0 in | Wt 189.2 lb

## 2021-11-08 DIAGNOSIS — L989 Disorder of the skin and subcutaneous tissue, unspecified: Secondary | ICD-10-CM

## 2021-11-08 MED ORDER — FLUOCINONIDE 0.05 % EX SOLN: 1.0000 "application " | Freq: Two times a day (BID) | CUTANEOUS | 0 refills | Status: DC

## 2021-11-08 NOTE — Patient Instructions (Signed)
It was nice seeing you today and welcome to our practice. We changed your MyChart password as discussed. ? ?Username: [email or web address] ?New password: [id number] ?Please, change your password as soon as possible. ?

## 2021-11-08 NOTE — Progress Notes (Signed)
? ? ?  SUBJECTIVE:  ? ?CHIEF COMPLAINT / HPI:  ? ?Scalp lesion: ?C/O itchy, occasionally painful red scalp with intermittent scaring. This started about nine months ago. Two weeks ago, he noticed some bumps on his scalp, which has now resolved. This is associated with hair loss. He denies any known triggers.  ? ? ?PERTINENT  PMH / PSH: PMHx reviewed ? ?OBJECTIVE:  ? ?Vitals:  ? 11/08/21 1546  ?BP: 120/80  ?Pulse: 81  ?Temp: 97.6 ?F (36.4 ?C)  ?SpO2: 98%  ?Weight: 189 lb 3.2 oz (85.8 kg)  ?Height: 5\' 8"  (1.727 m)  ? ? ?Physical Exam ?Vitals and nursing note reviewed.  ?Constitutional:   ?   Appearance: He is not ill-appearing.  ?Skin: ?   Comments: Dry, non-scaly, pink scalp with no lesions. No scaring or obvious hair loss  ? ? ? ?ASSESSMENT/PLAN:  ? ?Scalp lesion: ?His scalp is mildly erythematous. ??? Some element of seborrheic dermatitis ?I discussed trial of topical steroid solution. ?Medication (Fluocinonide) escribed. ?F/U soon if there is no improvement. ?He agreed with the plan.  ? ?Andrena Mews, MD ?[place]  ? ?

## 2021-11-13 ENCOUNTER — Telehealth: Payer: Self-pay

## 2021-11-13 ENCOUNTER — Other Ambulatory Visit (HOSPITAL_COMMUNITY): Payer: Self-pay

## 2021-11-13 NOTE — Telephone Encounter (Signed)
A Prior Authorization was initiated for this patients FLUOCINONIDE SOL through CoverMyMeds.  ? ?Key: [id number] ? ?

## 2021-11-14 NOTE — Telephone Encounter (Signed)
Prior Auth for patients medication FLUOCINONIDE0.05%  SOL approved by [employer] from 11/13/21 to 11/13/22 ? ?Key: [id number] ? ?Patients pharmacy notified. ? ?

## 2022-01-23 DIAGNOSIS — L648 Other androgenic alopecia: Secondary | ICD-10-CM | POA: Diagnosis not present

## 2022-01-23 DIAGNOSIS — L639 Alopecia areata, unspecified: Secondary | ICD-10-CM | POA: Diagnosis not present

## 2022-01-23 DIAGNOSIS — R21 Rash and other nonspecific skin eruption: Secondary | ICD-10-CM | POA: Diagnosis not present

## 2022-01-23 DIAGNOSIS — L218 Other seborrheic dermatitis: Secondary | ICD-10-CM | POA: Diagnosis not present

## 2022-01-27 ENCOUNTER — Ambulatory Visit (HOSPITAL_COMMUNITY)
Admission: EM | Admit: 2022-01-27 | Discharge: 2022-01-27 | Disposition: A | Discharge status: Home / Self Care | Payer: Medicaid Other | Attending: Student | Admitting: Student

## 2022-01-27 DIAGNOSIS — R1032 Left lower quadrant pain: Secondary | ICD-10-CM | POA: Diagnosis not present

## 2022-01-27 NOTE — ED Triage Notes (Signed)
Patient presents to Urgent Care with complaints of L groin pain since 4 weeks ago. Patient reports he lifted a table about 4 weeks ago and he felt a shooting pain, pt reports taking tylenol and Advil for pain Pain 4/10.

## 2022-01-27 NOTE — ED Provider Notes (Addendum)
[facility]    CSN: [id number] Arrival date & time: 01/27/22  1002      History   Chief Complaint Chief Complaint  Patient presents with   Groin Pain    HPI Todd Jacobs is a 21 y.o. male presenting with L groin pain x4 weeks. History noncontributory. States pain is worse at night. Pain is minimal at time of visit. Denies constipation, urinary symptoms. They apparently presented to a different urgent care specifically requesting xray imaging for the groin pain, and were told to present to this site.  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 Authorizing Provider  clindamycin-benzoyl peroxide (BENZACLIN) gel APPLY EXTERNALLY TO THE AFFECTED AREA DAILY 09/01/19   Lady Deutscher, MD  fluocinonide (LIDEX) 0.05 % external solution Apply 1 application. topically 2 (two) times daily. 11/08/21   Doreene Eland, MD  fluticasone (FLONASE) 50 MCG/ACT nasal spray USE 2 SPRAYS IN EACH NOSTRIL DAILY. USE AFTER CLEARING NOSE WITH NASALE SALINE. 10/27/19   [name], MD  montelukast (SINGULAIR) 10 MG tablet Take 1 tablet (10 mg total) by mouth at bedtime. 06/01/19   [name], MD  PROAIR HFA 108 [phone] Base) MCG/ACT inhaler INHALE 2 PUFFS INTO THE LUNGS EVERY 4 HOURS AS NEEDED FOR WHEEZING OR COUGH 11/04/19   Florestine Avers [place], MD    Family History Family History  Problem Relation Age of Onset   Asthma Other    Allergies Other     Social History Social History   Tobacco Use    Smoking status: Never    Passive exposure: Yes   Smokeless tobacco: Never   Tobacco comments:    dad smokes outside  Vaping Use   Vaping Use: Never used  Substance Use Topics   Alcohol use: Not Currently   Drug use: Yes    Types: Marijuana     Allergies   Patient has no known allergies.   Review of Systems Review of Systems  Gastrointestinal:  Positive for abdominal pain.  All other systems reviewed and are negative.    Physical Exam Triage Vital Signs ED Triage Vitals  Enc Vitals Group     BP 01/27/22 1013 107/67     Pulse Rate 01/27/22 1013 86     Resp 01/27/22 1013 18     Temp 01/27/22 1013 98.7 F (37.1 C)     Temp src --      SpO2 01/27/22 1013 98 %     Weight --      Height --      Head Circumference --      Peak Flow --      Pain Score 01/27/22 1012 6     Pain Loc --      Pain Edu? --      Excl. in GC? --    No data found.  Updated Vital Signs BP 107/67   Pulse 86   Temp 98.7  F (37.1 C)   Resp 18   SpO2 98%   Visual Acuity Right Eye Distance:   Left Eye Distance:   Bilateral Distance:    Right Eye Near:   Left Eye Near:    Bilateral Near:     Physical Exam Vitals reviewed.  Constitutional:      General: He is not in acute distress.    Appearance: Normal appearance. He is not ill-appearing.  HENT:     Head: Normocephalic and atraumatic.  Pulmonary:     Effort: Pulmonary effort is normal.  Abdominal:     Tenderness: There is no abdominal tenderness. There is no right CVA tenderness, left CVA tenderness, guarding or rebound. Negative signs include Murphy's sign, Rovsing's sign and McBurney's sign.     Hernia: No hernia is present.     Comments: The left inguinal canal is mildly tender to palpation, but I did not palpate a hernia. Chaperone : Sports administrator. Penile exam not performed.    Neurological:     General: No focal deficit present.     Mental Status: He is alert and oriented to person, place, and time.  Psychiatric:        Mood  and Affect: Mood normal.        Behavior: Behavior normal.        Thought Content: Thought content normal.        Judgment: Judgment normal.      UC Treatments / Results  Labs (all labs ordered are listed, but only abnormal results are displayed) Labs Reviewed - No data to display  EKG   Radiology No results found.  Procedures Procedures (including critical care time)  Medications Ordered in UC Medications - No data to display  Initial Impression / Assessment and Plan / UC Course  I have reviewed the triage vital signs and the nursing notes.  Pertinent labs & imaging results that were available during my care of the patient were reviewed by me and considered in my medical decision making (see chart for details).     This patient is a 21 y.o. year old male presenting with L groin pain x1 month following heavy lifting. Afebrile, nontachy. BS positive throughout, denies constipation. I did not appreciate a hernia on exam but the L inguinal canal is mildly tender to palpation. He denies penile symptoms or STI risk. Given mild symptoms at time of visit, will proceed with referral to general surgery, with strict ED return precautions discussed.   Mother was initially upset that we do not have onsite ultrasound, as she was apparently told that we would x-ray the groin  here.  We discussed that x-ray imaging is not used for hernia as this is not a bony or joint problem.  We do not have onsite ultrasound.  Mother was also upset that I cannot send referral to a specific physician, we discussed that at urgent care I cannot send a true referral, but I am happy to refer her son to general surgery. They can follow-up with his PCP for further concerns and referrals.  Final Clinical Impressions(s) / UC Diagnoses   Final diagnoses:  Left groin pain     Discharge Instructions      -You may have a hernia.  This is a bulge in the abdominal wall, and can become dangerous if the intestines  get stuck in it.  Right now, you are having minimal pain, and I do not feel a bulge, so I do not think this  is a medical emergency.  If your symptoms change, and you develop severe pain, constipation, changes in urination-head to the emergency department. -An xray would not be helpful for this, as you are not having bone or joint pain. -I have sent a referral to general surgery, they should call to set this up, or you can call them if they do not reach out to you.  Referrals from urgent care are tricky because I cannot help you schedule the appointment.  Follow-up with your primary care provider if you have any issues with this.   ED Prescriptions   None    PDMP not reviewed this encounter.   Rhys Martini, PA-C 01/27/22 1036    Rhys Martini, PA-C 01/27/22 1037

## 2022-01-27 NOTE — ED Notes (Signed)
Chaperoned a groin assessment

## 2022-01-27 NOTE — Discharge Instructions (Addendum)
-  You may have a hernia.  This is a bulge in the abdominal wall, and can become dangerous if the intestines get stuck in it.  Right now, you are having minimal pain, and I do not feel a bulge, so I do not think this is a medical emergency.  If your symptoms change, and you develop severe pain, constipation, changes in urination-head to the emergency department. -An xray would not be helpful for this, as you are not having bone or joint pain. -I have sent a referral to general surgery, they should call to set this up, or you can call them if they do not reach out to you.  Referrals from urgent care are tricky because I cannot help you schedule the appointment.  Follow-up with your primary care provider if you have any issues with this.

## 2022-01-31 ENCOUNTER — Encounter: Payer: Self-pay | Admitting: Surgery

## 2022-01-31 ENCOUNTER — Other Ambulatory Visit: Payer: Self-pay

## 2022-01-31 ENCOUNTER — Ambulatory Visit: Payer: Medicaid Other | Admitting: Surgery

## 2022-01-31 VITALS — BP 127/82 | HR 103 | Temp 98.4°F | Wt 187.0 lb

## 2022-01-31 DIAGNOSIS — R1032 Left lower quadrant pain: Secondary | ICD-10-CM

## 2022-01-31 NOTE — Progress Notes (Signed)
Patient ID: Todd Jacobs, male   DOB: [birth year], 21 y.o.   MRN: [id number]  Chief Complaint: Left groin pain  History of Present Illness Todd SOKOLOWSKI is a 21 y.o. male with a 6-week history of left groin pain and stinging sensation.  Seemed to be initially provoked by lifting a very large plate of glass.  Thought to have a bulge in the area which gradually disappeared.  Prior history of some kind of hernia type pain in the right groin which is since resolved.  Has had belt-like lower abdominal pain with heavy lifting in the past.  Denies any exacerbation by coughing or sneezing.  Past Medical History Past Medical History:  Diagnosis Date   Allergy    Asthma    Otitis media    Sinusitis       Past Surgical History:  Procedure Laterality Date   TYMPANOSTOMY TUBE PLACEMENT      No Known Allergies  Current Outpatient Medications  Medication Sig Dispense Refill   cetirizine (ZYRTEC) 10 MG tablet Take 10 mg by mouth daily.     fluticasone (FLONASE) 50 MCG/ACT nasal spray USE 2 SPRAYS IN EACH NOSTRIL DAILY. USE AFTER CLEARING NOSE WITH NASALE SALINE. 16 g 12   montelukast (SINGULAIR) 10 MG tablet Take 1 tablet (10 mg total) by mouth at bedtime. 30 tablet 11   PROAIR HFA 108 (90 Base) MCG/ACT inhaler INHALE 2 PUFFS INTO THE LUNGS EVERY 4 HOURS AS NEEDED FOR WHEEZING OR COUGH 17 g 0   No current facility-administered medications for this visit.    Family History Family History  Problem Relation Age of Onset   Asthma Other    Allergies Other       Social History Social History   Tobacco Use   Smoking status: Never    Passive exposure: Yes   Smokeless tobacco: Never   Tobacco comments:    dad smokes outside  Vaping Use   Vaping Use: Never used  Substance Use Topics   Alcohol use: Not Currently   Drug use: Yes    Types: Marijuana        Review of Systems  Constitutional: Negative.   HENT: Negative.    Eyes: Negative.   Respiratory: Negative.     Cardiovascular: Negative.   Gastrointestinal:  Positive for abdominal pain.  Genitourinary: Negative.   Skin: Negative.   Neurological: Negative.   Psychiatric/Behavioral: Negative.        Physical Exam Blood pressure 127/82, pulse (!) 103, temperature 98.4 F (36.9 C), weight 187 lb (84.8 kg), SpO2 99 %. Last Weight  Most recent update: 01/31/2022  1:30 PM    Weight  84.8 kg (187 lb)             CONSTITUTIONAL: Well developed, and nourished, appropriately responsive and aware without distress.   EYES: Sclera non-icteric.   EARS, NOSE, MOUTH AND THROAT:  The oropharynx is clear. Oral mucosa is pink and moist.   Hearing is intact to voice.  NECK: Trachea is midline, and there is no jugular venous distension.  LYMPH NODES:  Lymph nodes in the neck are not enlarged. RESPIRATORY:  Lungs are clear, and breath sounds are equal bilaterally. Normal respiratory effort without pathologic use of accessory muscles. CARDIOVASCULAR: Heart is regular in rate and rhythm. GI: The abdomen is soft, nontender, and nondistended. There were no palpable masses. I did not appreciate hepatosplenomegaly. There were normal bowel sounds. GU: Testicles are descended bilaterally without any tenderness or remarkable changes  in the appendages.  He is remarkably tender in the external inguinal ring on the left, but I am unable to appreciate a hernia sac on Valsalva.  He does have a lot of groin motion on proximal exam in the left groin.  Suspicion for some groin floor laxity may be present.  No appreciable right inguinal hernia. MUSCULOSKELETAL:  Symmetrical muscle tone appreciated in all four extremities.    SKIN: Skin turgor is normal. No pathologic skin lesions appreciated.  NEUROLOGIC:  Motor and sensation appear grossly normal.  Cranial nerves are grossly without defect. PSYCH:  Alert and oriented to person, place and time. Affect is appropriate for situation.  Data Reviewed I have personally reviewed what  is currently available of the patient's imaging, recent labs and medical records.   Labs:     Latest Ref Rng & Units 03/21/2017   10:32 AM  CBC  WBC 4.5 - 13.0 Thousand/uL 6.4   Hemoglobin 12.0 - 16.9 g/dL 03.4   Hematocrit 91.7 - 49.0 % 48.3   Platelets 140 - 400 Thousand/uL 292        No data to display            Imaging:  Within last 24 hrs: No results found.  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  Follow-up after.  Face-to-face time spent with the patient and accompanying care providers(if present) was 30 minutes, with more than 50% of the time spent counseling, educating, and coordinating care of the patient.    These notes generated with voice recognition software. I apologize for typographical errors.  Campbell Lerner M.D., FACS 01/31/2022, 2:27 PM

## 2022-01-31 NOTE — Patient Instructions (Addendum)
We will get you scheduled for an ultrasound of the groin area to better assess the area. We will call you about scheduling this in [place].   We will have you follow up here after we get the results.

## 2022-02-01 ENCOUNTER — Telehealth: Payer: Self-pay

## 2022-02-01 NOTE — Telephone Encounter (Addendum)
Call to patient to inform of Ultrasound scheduling.  The patient is scheduled for an ultrasound at [facility] on 02/12/22 at 2:30 pm. He is to arrive there by 2:00 pm.  Unable to leave a message for the patient.

## 2022-02-05 NOTE — Telephone Encounter (Signed)
Ultrasound information given to patient's mother.

## 2022-02-11 ENCOUNTER — Telehealth: Payer: Self-pay | Admitting: Surgery

## 2022-02-11 ENCOUNTER — Other Ambulatory Visit: Payer: Self-pay

## 2022-02-11 DIAGNOSIS — R1032 Left lower quadrant pain: Secondary | ICD-10-CM

## 2022-02-11 NOTE — Telephone Encounter (Signed)
Call from [facility] in radiology. Patient has ultrasound scheduled for tomorrow 02/12/22 and the existing order needs to be changed to "US Pelvic Limited".  If you have any questions, you can call her at [phone]. Thank you.

## 2022-02-12 ENCOUNTER — Ambulatory Visit (HOSPITAL_COMMUNITY)
Admission: RE | Admit: 2022-02-12 | Discharge: 2022-02-12 | Disposition: A | Discharge status: Home / Self Care | Payer: Medicaid Other | Source: Ambulatory Visit | Attending: Surgery | Admitting: Surgery

## 2022-02-12 DIAGNOSIS — R1032 Left lower quadrant pain: Secondary | ICD-10-CM | POA: Diagnosis not present

## 2022-02-12 DIAGNOSIS — R1030 Lower abdominal pain, unspecified: Secondary | ICD-10-CM | POA: Diagnosis not present

## 2022-02-19 ENCOUNTER — Ambulatory Visit: Payer: Medicaid Other | Admitting: Surgery

## 2022-02-28 ENCOUNTER — Encounter: Payer: Self-pay | Admitting: Surgery

## 2022-02-28 ENCOUNTER — Ambulatory Visit: Payer: Medicaid Other | Admitting: Surgery

## 2022-02-28 ENCOUNTER — Other Ambulatory Visit: Payer: Self-pay

## 2022-02-28 VITALS — BP 109/70 | HR 90 | Temp 98.4°F | Ht 70.0 in | Wt 179.8 lb

## 2022-02-28 DIAGNOSIS — R1032 Left lower quadrant pain: Secondary | ICD-10-CM

## 2022-02-28 NOTE — Progress Notes (Signed)
Reviewed his recent history, he appears to be making good progress having diminishing pain in terms of how it lingers when he is walking.  He also reports the discomfort does not interfere with any of his activities or responsibilities. He feels it is getting better has utilized both aspirin and ibuprofen from time to time with good results.  Exam not repeated. CLINICAL DATA:  Left groin pain for 4 weeks   EXAM: LIMITED ULTRASOUND OF PELVIS   TECHNIQUE: Limited transabdominal ultrasound examination of the pelvis was performed.   COMPARISON:  None Available.   FINDINGS: Multiple morphologically normal lymph nodes are identified in the region of the patient's pain. No adenopathy or suspicious mass. No hernia.   IMPRESSION: No cause for the patient's symptoms identified. Morphologically normal lymph nodes are identified in the region of pain.     Electronically Signed   By: Gerome Sam III M.D.   On: 02/12/2022 18:22  Without any evidence of inguinal hernia, I do not feel I have a surgical fix for this gentleman's pain/discomfort.  Anticipate as he continues to favor it and utilize anti-inflammatories that this will improve over time.  We will be glad to see him back as needed should his clinical progress make a turn.

## 2022-02-28 NOTE — Patient Instructions (Signed)
Please call with any questions or concerns.

## 2022-04-15 DIAGNOSIS — L648 Other androgenic alopecia: Secondary | ICD-10-CM | POA: Diagnosis not present

## 2022-04-15 DIAGNOSIS — L218 Other seborrheic dermatitis: Secondary | ICD-10-CM | POA: Diagnosis not present

## 2022-04-16 DIAGNOSIS — L648 Other androgenic alopecia: Secondary | ICD-10-CM | POA: Diagnosis not present

## 2022-12-07 ENCOUNTER — Ambulatory Visit (HOSPITAL_COMMUNITY)
Admission: EM | Admit: 2022-12-07 | Discharge: 2022-12-07 | Disposition: A | Discharge status: Home / Self Care | Payer: Medicaid Other | Attending: Emergency Medicine | Admitting: Emergency Medicine

## 2022-12-07 ENCOUNTER — Encounter (HOSPITAL_COMMUNITY): Payer: Self-pay | Admitting: Emergency Medicine

## 2022-12-07 DIAGNOSIS — Z760 Encounter for issue of repeat prescription: Secondary | ICD-10-CM

## 2022-12-07 DIAGNOSIS — J302 Other seasonal allergic rhinitis: Secondary | ICD-10-CM | POA: Diagnosis not present

## 2022-12-07 DIAGNOSIS — J4520 Mild intermittent asthma, uncomplicated: Secondary | ICD-10-CM

## 2022-12-07 MED ORDER — PROAIR HFA 108 (90 BASE) MCG/ACT IN AERS: INHALATION_SPRAY | RESPIRATORY_TRACT | 0 refills | Status: DC

## 2022-12-07 MED ORDER — MONTELUKAST SODIUM 10 MG PO TABS: 10.0000 mg | ORAL_TABLET | Freq: Every day | ORAL | 11 refills | Status: AC

## 2022-12-07 NOTE — ED Triage Notes (Addendum)
Pt has reoccurring skin issues with boils.  Reports having hair loss (of head) over the past several months.  Pt having sinus issues as well. Needing refill of Montelukast as been taking mother's since he is out of his prescription.   Pt has medicaid but aged out of the assigned doctor he had. This RN informed them to contact medicaid to see who the new assigned PCP is.

## 2022-12-07 NOTE — Discharge Instructions (Signed)
I have refilled his montelukast and his inhaler.  For his boils please consider an antibacterial soap like Dial or Dove sensitive skin.  Please reach out to Medicaid to see who they have assigned as his primary care provider.  If you are unable to get in contact with them, there is information attached to these discharge papers on how to reach out for a primary care provider through [facility] health.  I have given you the information for [place] community health and wellness as well, sometimes they have a bit of a wait to get in.  Please return to clinic for any new or urgent concerns.

## 2022-12-07 NOTE — ED Provider Notes (Signed)
[facility]    CSN: [id number] Arrival date & time: 12/07/22  1029      History   Chief Complaint Chief Complaint  Patient presents with   Skin Problem   Medication Refill   Alopecia    HPI ZE COLALUCA is a 22 y.o. male.   Patient presents to clinic with his mother, who is giving most of the medical history.  Mother's concern for recent hair loss, weight loss and recurrent boils.  Mother reports that she had similar symptoms and was in contact with poison control over concern for heavy metal poisoning, her workup was negative.  Would also like a refill of his Singulair and ProAir inhaler.  Denies any recent asthma exacerbations, shortness of breath, wheezing or fevers.  No current boils.  Patient has Medicaid, will reach out to find a primary care to see who he was assigned.     The history is provided by the patient, medical records and a caregiver.  Medication Refill   Past Medical History:  Diagnosis Date   Allergy    Asthma    Otitis media    Sinusitis     Patient Active Problem List   Diagnosis Date Noted   Groin pain, left 01/31/2022   BMI (body mass index), pediatric, 95-99% for age 72/17/2020   Functional dyspepsia 10/27/2017   Asthma, chronic 04/05/2014   Acne vulgaris 04/05/2014   Seasonal allergic rhinitis 04/05/2014    Past Surgical History:  Procedure Laterality Date   TYMPANOSTOMY TUBE PLACEMENT         Home Medications    Prior to Admission medications   Medication Sig Start Date End Date Taking? Authorizing Provider  cetirizine (ZYRTEC) 10 MG tablet Take 10 mg by mouth daily.   Yes [provider]  clobetasol (TEMOVATE) 0.05 % external solution Apply topically. 01/23/22   [provider]  fluticasone (FLONASE) 50 MCG/ACT nasal spray USE 2 SPRAYS IN EACH NOSTRIL DAILY. USE AFTER CLEARING NOSE WITH NASALE SALINE. 10/27/19   [name], MD  ketoconazole (NIZORAL) 2 % shampoo Apply topically. 01/23/22    [provider]  montelukast (SINGULAIR) 10 MG tablet Take 1 tablet (10 mg total) by mouth at bedtime. 12/07/22   Verleen Stuckey, [place] N, FNP  PROAIR HFA 108 [phone] Base) MCG/ACT inhaler INHALE 2 PUFFS INTO THE LUNGS EVERY 4 HOURS AS NEEDED FOR WHEEZING OR COUGH 12/07/22   Keldan Eplin, [place] N, FNP    Family History Family History  Problem Relation Age of Onset   Asthma Other    Allergies Other     Social History Social History   Tobacco Use   Smoking status: Never    Passive exposure: Yes   Smokeless tobacco: Never   Tobacco comments:    dad smokes outside  Vaping Use   Vaping Use: Never used  Substance Use Topics   Alcohol use: Not Currently   Drug use: Yes    Types: Marijuana     Allergies   Patient has no known allergies.   Review of Systems Review of Systems  Constitutional:  Positive for unexpected weight change.  Respiratory:  Negative for cough, shortness of breath and wheezing.   Cardiovascular:  Negative for chest pain.  Skin:  Negative for rash and wound.     Physical Exam Triage Vital Signs ED Triage Vitals  Enc Vitals Group     BP 12/07/22 1100 109/74     Pulse Rate 12/07/22 1100 81  Resp 12/07/22 1100 14     Temp 12/07/22 1100 98 F (36.7 C)     Temp Source 12/07/22 1100 Oral     SpO2 12/07/22 1100 98 %     Weight --      Height --      Head Circumference --      Peak Flow --      Pain Score 12/07/22 1058 0     Pain Loc --      Pain Edu? --      Excl. in GC? --    No data found.  Updated Vital Signs BP 109/74 (BP Location: Left Arm)   Pulse 81   Temp 98 F (36.7 C) (Oral)   Resp 14   SpO2 98%   Visual Acuity Right Eye Distance:   Left Eye Distance:   Bilateral Distance:    Right Eye Near:   Left Eye Near:    Bilateral Near:     Physical Exam Vitals and nursing note reviewed.  Constitutional:      Appearance: Normal appearance.  HENT:     Head: Normocephalic and atraumatic.     Right Ear: External ear normal.      Left Ear: External ear normal.     Nose: Nose normal.     Mouth/Throat:     Mouth: Mucous membranes are moist.  Eyes:     Conjunctiva/sclera: Conjunctivae normal.  Cardiovascular:     Rate and Rhythm: Normal rate.  Pulmonary:     Effort: Pulmonary effort is normal. No respiratory distress.  Neurological:     General: No focal deficit present.     Mental Status: He is alert and oriented to person, place, and time.  Psychiatric:        Mood and Affect: Mood normal.        Behavior: Behavior normal.      UC Treatments / Results  Labs (all labs ordered are listed, but only abnormal results are displayed) Labs Reviewed - No data to display  EKG   Radiology No results found.  Procedures Procedures (including critical care time)  Medications Ordered in UC Medications - No data to display  Initial Impression / Assessment and Plan / UC Course  I have reviewed the triage vital signs and the nursing notes.  Pertinent labs & imaging results that were available during my care of the patient were reviewed by me and considered in my medical decision making (see chart for details).  Vitals and triage reviewed, patient is hemodynamically stable.  Requesting medication refill, we will refill Singulair and ProAir.  Encouraged to follow-up with a primary care provider for routine labs and further evaluation of weight loss, skin boils, and hair loss.  History of asthma, no current shortness of breath, wheezing or fevers.  Provided with information for [facility] health community health and wellness.  Mother verbalized understanding, no questions at this time.     Final Clinical Impressions(s) / UC Diagnoses   Final diagnoses:  Medication refill  Seasonal allergies     Discharge Instructions      I have refilled his montelukast and his inhaler.  For his boils please consider an antibacterial soap like Dial or Dove sensitive skin.  Please reach out to Medicaid to see who they have  assigned as his primary care provider.  If you are unable to get in contact with them, there is information attached to these discharge papers on how to reach out for a primary  care provider through [facility] health.  I have given you the information for [place] community health and wellness as well, sometimes they have a bit of a wait to get in.  Please return to clinic for any new or urgent concerns.      ED Prescriptions     Medication Sig Dispense Auth. Provider   montelukast (SINGULAIR) 10 MG tablet Take 1 tablet (10 mg total) by mouth at bedtime. 30 tablet Shemeca Lukasik, [place] N, [place]   PROAIR HFA 108 [phone] Base) MCG/ACT inhaler INHALE 2 PUFFS INTO THE LUNGS EVERY 4 HOURS AS NEEDED FOR WHEEZING OR COUGH 17 g Deigo Alonso, [place] N, FNP      PDMP not reviewed this encounter.   Raphael Espe, [place] N, [place] 12/07/22 1114

## 2023-04-02 ENCOUNTER — Ambulatory Visit (HOSPITAL_COMMUNITY)
Admission: EM | Admit: 2023-04-02 | Discharge: 2023-04-02 | Disposition: A | Discharge status: Home / Self Care | Payer: Medicaid Other | Attending: Family Medicine | Admitting: Family Medicine

## 2023-04-02 ENCOUNTER — Encounter (HOSPITAL_COMMUNITY): Payer: Self-pay

## 2023-04-02 DIAGNOSIS — Z20822 Contact with and (suspected) exposure to covid-19: Secondary | ICD-10-CM | POA: Insufficient documentation

## 2023-04-02 DIAGNOSIS — J069 Acute upper respiratory infection, unspecified: Secondary | ICD-10-CM | POA: Diagnosis not present

## 2023-04-02 MED ORDER — ALBUTEROL SULFATE HFA 108 (90 BASE) MCG/ACT IN AERS: 2.0000 | INHALATION_SPRAY | Freq: Four times a day (QID) | RESPIRATORY_TRACT | Status: AC | PRN

## 2023-04-02 MED ORDER — PROMETHAZINE-DM 6.25-15 MG/5ML PO SYRP: 5.0000 mL | ORAL_SOLUTION | Freq: Three times a day (TID) | ORAL | 0 refills | Status: DC | PRN

## 2023-04-02 MED ORDER — ALBUTEROL SULFATE HFA 108 (90 BASE) MCG/ACT IN AERS
2.0000 | INHALATION_SPRAY | Freq: Four times a day (QID) | RESPIRATORY_TRACT | Status: DC | PRN
  Administered 2023-04-02: 2 via RESPIRATORY_TRACT

## 2023-04-02 MED ORDER — ALBUTEROL SULFATE HFA 108 (90 BASE) MCG/ACT IN AERS
INHALATION_SPRAY | RESPIRATORY_TRACT | Status: AC
  Filled 2023-04-02: qty 6.7

## 2023-04-02 NOTE — ED Triage Notes (Signed)
Pt c/o cough, congestion, headache, fever, and body aches x1wk. Taking OTC meds for sx with little relief.

## 2023-04-02 NOTE — ED Provider Notes (Signed)
[facility]    CSN: [id number] Arrival date & time: 04/02/23  1208      History   Chief Complaint Chief Complaint  Patient presents with   Cough    HPI Todd Jacobs is a 22 y.o. male.    Cough Patient with a history of asthma, seasonal allergies, presents today with a 1 week history of URI symptoms such as headache, congestion and cough, subjective fever, generalized bodyaches.  He has been taking over-the-counter medications he reports with some relief of symptoms.  He chronically takes Zyrtec for allergy symptoms.  He is a non-smoker.  Endorses that his asthma symptoms are generally well-controlled.  Reports chest tightness with persistent coughing.   Past Medical History:  Diagnosis Date   Allergy    Asthma    Otitis media    Sinusitis     Patient Active Problem List   Diagnosis Date Noted   Groin pain, left 01/31/2022   BMI (body mass index), pediatric, 95-99% for age 05/01/2019   Functional dyspepsia 10/27/2017   Asthma, chronic 04/05/2014   Acne vulgaris 04/05/2014   Seasonal allergic rhinitis 04/05/2014    Past Surgical History:  Procedure Laterality Date   TYMPANOSTOMY TUBE PLACEMENT         Home Medications    Prior to Admission medications   Medication Sig Start Date End Date Taking? Authorizing Provider  albuterol (VENTOLIN HFA) 108 (90 Base) MCG/ACT inhaler Inhale 2 puffs into the lungs every 6 (six) hours as needed for wheezing or shortness of breath. 04/02/23  Yes Bing Neighbors, NP  promethazine-dextromethorphan (PROMETHAZINE-DM) 6.25-15 MG/5ML syrup Take 5 mLs by mouth 3 (three) times daily as needed (cough and cold symptoms). 04/02/23  Yes Bing Neighbors, NP  cetirizine (ZYRTEC) 10 MG tablet Take 10 mg by mouth daily.    [provider]  clobetasol (TEMOVATE) 0.05 % external solution Apply topically. 01/23/22   [provider]  fluticasone (FLONASE) 50 MCG/ACT nasal spray USE 2 SPRAYS IN EACH NOSTRIL DAILY.  USE AFTER CLEARING NOSE WITH NASALE SALINE. 10/27/19   [name], MD  ketoconazole (NIZORAL) 2 % shampoo Apply topically. 01/23/22   [provider]  montelukast (SINGULAIR) 10 MG tablet Take 1 tablet (10 mg total) by mouth at bedtime. 12/07/22   Garrison, [place] N, FNP    Family History Family History  Problem Relation Age of Onset   Healthy Mother    Asthma Other    Allergies Other     Social History Social History   Tobacco Use   Smoking status: Never    Passive exposure: Yes   Smokeless tobacco: Never   Tobacco comments:    dad smokes outside  Vaping Use   Vaping status: Never Used  Substance Use Topics   Alcohol use: Not Currently   Drug use: Yes    Types: Marijuana     Allergies   Patient has no known allergies.   Review of Systems Review of Systems  Respiratory:  Positive for cough.      Physical Exam Triage Vital Signs ED Triage Vitals [04/02/23 1348]  Encounter Vitals Group     BP 111/77     Systolic BP Percentile      Diastolic BP Percentile      Pulse Rate 89     Resp 18     Temp 97.9 F (36.6 C)     Temp Source Oral     SpO2 97 %  Weight      Height      Head Circumference      Peak Flow      Pain Score 2     Pain Loc      Pain Education      Exclude from Growth Chart    No data found.  Updated Vital Signs BP 111/77 (BP Location: Left Arm)   Pulse 89   Temp 97.9 F (36.6 C) (Oral)   Resp 18   SpO2 97%   Visual Acuity Right Eye Distance:   Left Eye Distance:   Bilateral Distance:    Right Eye Near:   Left Eye Near:    Bilateral Near:     Physical Exam Vitals reviewed.  Constitutional:      Appearance: Normal appearance.  HENT:     Head: Normocephalic and atraumatic.     Right Ear: Tympanic membrane, ear canal and external ear normal.     Left Ear: Tympanic membrane, ear canal and external ear normal.     Nose: Congestion and rhinorrhea present.  Eyes:     Extraocular Movements: Extraocular  movements intact.     Conjunctiva/sclera: Conjunctivae normal.     Pupils: Pupils are equal, round, and reactive to light.  Cardiovascular:     Rate and Rhythm: Normal rate and regular rhythm.  Pulmonary:     Effort: Pulmonary effort is normal.     Breath sounds: Normal breath sounds.  Musculoskeletal:        General: Normal range of motion.     Cervical back: Normal range of motion and neck supple.  Neurological:     General: No focal deficit present.     Mental Status: He is alert.      UC Treatments / Results  Labs (all labs ordered are listed, but only abnormal results are displayed) Labs Reviewed  SARS CORONAVIRUS 2 (TAT 6-24 HRS)    EKG   Radiology No results found.  Procedures Procedures (including critical care time)  Medications Ordered in UC Medications  albuterol (VENTOLIN HFA) 108 (90 Base) MCG/ACT inhaler 2 puff (2 puffs Inhalation Given 04/02/23 1504)    Initial Impression / Assessment and Plan / UC Course  I have reviewed the triage vital signs and the nursing notes.  Pertinent labs & imaging results that were available during my care of the patient were reviewed by me and considered in my medical decision making (see chart for details).    Viral URI, symptom management warranted.  Treatment per discharge medication orders.Albuterol inhaler 2 puffs given here in clinic for shortness of breath and chest tightness. COVID Testing pending.  Return precautions given if symptoms worsen . Final Clinical Impressions(s) / UC Diagnoses   Final diagnoses:  Viral URI with cough  Encounter for laboratory testing for COVID-19 virus     Discharge Instructions      Your COVID 19 results will be available in 24 hours. Negative results are immediately resulted to Mychart. Positive results will receive a follow-up call from our clinic. Suspect viral illness and combination of seasonal allergies, symptoms management recommended.       ED Prescriptions      Medication Sig Dispense Auth. Provider   promethazine-dextromethorphan (PROMETHAZINE-DM) 6.25-15 MG/5ML syrup Take 5 mLs by mouth 3 (three) times daily as needed (cough and cold symptoms). 180 mL Bing Neighbors, NP   albuterol (VENTOLIN HFA) 108 (90 Base) MCG/ACT inhaler Inhale 2 puffs into the lungs every 6 (six) hours as  needed for wheezing or shortness of breath. -- Bing Neighbors, NP      PDMP not reviewed this encounter.   Bing Neighbors, NP 04/06/23 1154

## 2023-04-02 NOTE — Discharge Instructions (Signed)
Your COVID 19 results will be available in 24 hours. Negative results are immediately resulted to Mychart. Positive results will receive a follow-up call from our clinic. Suspect viral illness and combination of seasonal allergies, symptoms management recommended.

## 2023-04-03 LAB — SARS CORONAVIRUS 2 (TAT 6-24 HRS): SARS Coronavirus 2: NEGATIVE

## 2024-05-03 ENCOUNTER — Ambulatory Visit (HOSPITAL_COMMUNITY)
Admission: EM | Admit: 2024-05-03 | Discharge: 2024-05-03 | Disposition: A | Discharge status: Home / Self Care | Attending: Family Medicine | Admitting: Family Medicine

## 2024-05-03 ENCOUNTER — Encounter (HOSPITAL_COMMUNITY): Payer: Self-pay

## 2024-05-03 DIAGNOSIS — H60503 Unspecified acute noninfective otitis externa, bilateral: Secondary | ICD-10-CM

## 2024-05-03 DIAGNOSIS — H6123 Impacted cerumen, bilateral: Secondary | ICD-10-CM

## 2024-05-03 MED ORDER — OFLOXACIN 0.3 % OT SOLN: 5.0000 [drp] | Freq: Two times a day (BID) | OTIC | 0 refills | Status: AC

## 2024-05-03 NOTE — ED Provider Notes (Signed)
 [facility]    CSN: [id number] Arrival date & time: 05/03/24  1308      History   Chief Complaint Chief Complaint  Patient presents with   Ear Fullness    HPI Todd Jacobs is a 23 y.o. male.    Ear Fullness  Here for intermittent discomfort and decreased hearing in his right ear.  Is been bothering him for a few months off and on, but started again about 1 to 2 weeks ago.  No fever.  He has had a little bit of congestion but that is starting to get better.  It does not hurt to touch it and he has not had any discharge of the ear.  NKDA  Past Medical History:  Diagnosis Date   Allergy    Asthma    Otitis media    Sinusitis     Patient Active Problem List   Diagnosis Date Noted   Groin pain, left 01/31/2022   BMI (body mass index), pediatric, 95-99% for age 59/17/2020   Functional dyspepsia 10/27/2017   Asthma, chronic 04/05/2014   Acne vulgaris 04/05/2014   Seasonal allergic rhinitis 04/05/2014    Past Surgical History:  Procedure Laterality Date   TYMPANOSTOMY TUBE PLACEMENT         Home Medications    Prior to Admission medications   Medication Sig Start Date End Date Taking? Authorizing Provider  ofloxacin (FLOXIN) 0.3 % OTIC solution Place 5 drops into both ears 2 (two) times daily for 7 days. 05/03/24 05/10/24 Yes Vonna Sharlet POUR, MD  albuterol  (VENTOLIN  HFA) 108 (90 Base) MCG/ACT inhaler Inhale 2 puffs into the lungs every 6 (six) hours as needed for wheezing or shortness of breath. 04/02/23   Arloa Suzen RAMAN, NP  cetirizine  (ZYRTEC ) 10 MG tablet Take 10 mg by mouth daily.    [provider]  fluticasone  (FLONASE ) 50 MCG/ACT nasal spray USE 2 SPRAYS IN EACH NOSTRIL DAILY. USE AFTER CLEARING NOSE WITH NASALE SALINE. 10/27/19   Herminio Kirsch, MD  montelukast  (SINGULAIR ) 10 MG tablet Take 1 tablet (10 mg total) by mouth at bedtime. 12/07/22   Dreama Annia SAILOR, FNP    Family History Family History  Problem Relation  Age of Onset   Healthy Mother    Asthma Other    Allergies Other     Social History Social History   Tobacco Use   Smoking status: Never    Passive exposure: Yes   Smokeless tobacco: Never   Tobacco comments:    dad smokes outside  Vaping Use   Vaping status: Never Used  Substance Use Topics   Alcohol use: Not Currently   Drug use: Yes    Types: Marijuana     Allergies   Patient has no known allergies.   Review of Systems Review of Systems   Physical Exam Triage Vital Signs ED Triage Vitals [05/03/24 1436]  Encounter Vitals Group     BP 113/74     Girls Systolic BP Percentile      Girls Diastolic BP Percentile      Boys Systolic BP Percentile      Boys Diastolic BP Percentile      Pulse Rate 88     Resp 16     Temp 97.7 F (36.5 C)     Temp Source Oral     SpO2 97 %     Weight      Height      Head Circumference  Peak Flow      Pain Score 0     Pain Loc      Pain Education      Exclude from Growth Chart    No data found.  Updated Vital Signs BP 113/74 (BP Location: Right Arm)   Pulse 88   Temp 97.7 F (36.5 C) (Oral)   Resp 16   SpO2 97%   Visual Acuity Right Eye Distance:   Left Eye Distance:   Bilateral Distance:    Right Eye Near:   Left Eye Near:    Bilateral Near:     Physical Exam Vitals reviewed.  Constitutional:      General: He is not in acute distress.    Appearance: He is not toxic-appearing.  HENT:     Ears:     Comments: The right tympanic membrane is completely obscured by dark brown cerumen.  I can see a small amount of the left tympanic membrane, but there is a large amount of cerumen in the ear canal also.    Nose: Nose normal.     Mouth/Throat:     Mouth: Mucous membranes are moist.     Pharynx: No oropharyngeal exudate or posterior oropharyngeal erythema.  Eyes:     Extraocular Movements: Extraocular movements intact.     Conjunctiva/sclera: Conjunctivae normal.     Pupils: Pupils are equal, round, and  reactive to light.  Cardiovascular:     Rate and Rhythm: Normal rate and regular rhythm.     Heart sounds: No murmur heard. Pulmonary:     Effort: Pulmonary effort is normal.     Breath sounds: Normal breath sounds.  Musculoskeletal:     Cervical back: Neck supple.  Lymphadenopathy:     Cervical: No cervical adenopathy.  Skin:    Coloration: Skin is not jaundiced or pale.  Neurological:     General: No focal deficit present.     Mental Status: He is alert and oriented to person, place, and time.  Psychiatric:        Behavior: Behavior normal.      UC Treatments / Results  Labs (all labs ordered are listed, but only abnormal results are displayed) Labs Reviewed - No data to display  EKG   Radiology No results found.  Procedures Procedures (including critical care time)  Medications Ordered in UC Medications - No data to display  Initial Impression / Assessment and Plan / UC Course  I have reviewed the triage vital signs and the nursing notes.  Pertinent labs & imaging results that were available during my care of the patient were reviewed by me and considered in my medical decision making (see chart for details).     Lavaged both ears with good results.  His ears feel better after the lavage.  On re-examination there is some mild erythema of both ear canals with a little bit of white adherent material on the walls of both ear canals.  Floxin is sent in for the otitis externa. Final Clinical Impressions(s) / UC Diagnoses   Final diagnoses:  Bilateral impacted cerumen  Acute otitis externa of both ears, unspecified type     Discharge Instructions      -Floxin eardrops-5 drops in the affected ear 2 times daily for 7 days.      ED Prescriptions     Medication Sig Dispense Auth. Provider   ofloxacin (FLOXIN) 0.3 % OTIC solution Place 5 drops into both ears 2 (two) times daily for 7 days.  5 mL Vonna Sharlet POUR, MD      PDMP not reviewed this  encounter.   Vonna Sharlet POUR, MD 05/03/24 [phone]

## 2024-05-03 NOTE — Discharge Instructions (Signed)
-  Floxin eardrops-5 drops in the affected ear 2 times daily for 7 days.

## 2024-05-03 NOTE — ED Triage Notes (Signed)
 Patient here today with c/o right ear fullness X 4-6 months. Denies pain. Patient states that occasionally he hears ringing.
